# Patient Record
Sex: Female | Born: 1980 | Race: Asian | Hispanic: No | Marital: Married | Smoking: Never smoker
Health system: Southern US, Community
[De-identification: ages and names within clinical notes are randomized; demographics above are authoritative.]

## PROBLEM LIST (undated history)

## (undated) DIAGNOSIS — B1910 Unspecified viral hepatitis B without hepatic coma: Secondary | ICD-10-CM

## (undated) HISTORY — PX: WISDOM TOOTH EXTRACTION: SHX21

## (undated) HISTORY — DX: Unspecified viral hepatitis B without hepatic coma: B19.10

## (undated) HISTORY — PX: LIVER BIOPSY: SHX301

---

## 2007-06-05 DIAGNOSIS — B1910 Unspecified viral hepatitis B without hepatic coma: Secondary | ICD-10-CM

## 2007-06-05 HISTORY — DX: Unspecified viral hepatitis B without hepatic coma: B19.10

## 2016-12-20 ENCOUNTER — Other Ambulatory Visit (HOSPITAL_COMMUNITY): Payer: Self-pay | Admitting: Gastroenterology

## 2016-12-20 DIAGNOSIS — B181 Chronic viral hepatitis B without delta-agent: Secondary | ICD-10-CM

## 2017-01-04 ENCOUNTER — Ambulatory Visit (HOSPITAL_COMMUNITY): Payer: Self-pay

## 2017-01-30 ENCOUNTER — Ambulatory Visit (HOSPITAL_COMMUNITY): Payer: BLUE CROSS/BLUE SHIELD

## 2017-03-06 ENCOUNTER — Ambulatory Visit (HOSPITAL_COMMUNITY)
Admission: RE | Admit: 2017-03-06 | Discharge: 2017-03-06 | Disposition: A | Discharge status: Home / Self Care | Payer: BLUE CROSS/BLUE SHIELD | Source: Ambulatory Visit | Attending: Gastroenterology | Admitting: Gastroenterology

## 2017-03-06 DIAGNOSIS — B181 Chronic viral hepatitis B without delta-agent: Secondary | ICD-10-CM | POA: Diagnosis not present

## 2017-05-09 ENCOUNTER — Encounter: Payer: Self-pay | Admitting: Family Medicine

## 2017-05-09 ENCOUNTER — Ambulatory Visit (INDEPENDENT_AMBULATORY_CARE_PROVIDER_SITE_OTHER): Payer: BLUE CROSS/BLUE SHIELD | Admitting: Family Medicine

## 2017-05-09 VITALS — BP 108/66 | HR 78 | Temp 98.4°F | Ht 62.5 in | Wt 121.6 lb

## 2017-05-09 DIAGNOSIS — Z0000 Encounter for general adult medical examination without abnormal findings: Secondary | ICD-10-CM

## 2017-05-09 DIAGNOSIS — Z23 Encounter for immunization: Secondary | ICD-10-CM

## 2017-05-09 DIAGNOSIS — R5383 Other fatigue: Secondary | ICD-10-CM

## 2017-05-09 DIAGNOSIS — B181 Chronic viral hepatitis B without delta-agent: Secondary | ICD-10-CM

## 2017-05-09 NOTE — Patient Instructions (Signed)
It was so nice to meet you today!  I'll see you in January for your PAP and labs.

## 2017-05-09 NOTE — Progress Notes (Signed)
Subjective:    Kathleen Aguilar is a 36 y.o. female and is here for a comprehensive physical exam.  Pertinent Gynecological History: Patient's last menstrual period was 04/15/2017. Last pap: normal. Due 2019  Health Maintenance Due  Topic Date Due  . HIV Screening  03/25/1998  . TETANUS/TDAP  03/25/2002  . PAP SMEAR  03/25/2004  . INFLUENZA VACCINE  01/31/2017   PMHx, SurgHx, SocialHx, Medications, and Allergies were reviewed in the Visit Navigator and updated as appropriate.   Past Medical History:  Diagnosis Date  . Hepatitis B infection 06/05/2007   Overview:  Hep C neg 2012.  Kathleen RomansNancy M. [name]'Connor, MD............  3:28 PM 07/02/2013     No past surgical history on file. No family history on file.   Social History   Tobacco Use  . Smoking status: Not on file  Substance Use Topics  . Alcohol use: Not on file  . Drug use: Not on file    Review of Systems:   Pertinent items are noted in the HPI. Otherwise, ROS is negative.  Objective:   BP 108/66   Pulse 78   Temp 98.4 F (36.9 C) (Oral)   Ht 5' 2.5" (1.588 m)   Wt 121 lb 9.6 oz (55.2 kg)   LMP 04/15/2017   SpO2 99%   BMI 21.89 kg/m    Wt Readings from Last 3 Encounters:  05/09/17 121 lb 9.6 oz (55.2 kg)     Ht Readings from Last 3 Encounters:  05/09/17 5' 2.5" (1.588 m)   General appearance: alert, cooperative and appears stated age. Head: normocephalic, without obvious abnormality, atraumatic. Neck: no adenopathy, supple, symmetrical, trachea midline; thyroid not enlarged, symmetric, no tenderness/mass/nodules. Lungs: clear to auscultation bilaterally. Heart: regular rate and rhythm Abdomen: soft, non-tender; no masses,  no organomegaly. Extremities: extremities normal, atraumatic, no cyanosis or edema. Skin: skin color, texture, turgor normal, no rashes or lesions. Lymph: cervical, supraclavicular, and axillary nodes normal; no abnormal inguinal nodes palpated. Neurologic: grossly  normal.  Assessment/Plan:   Kathleen Aguilar was seen today for establish care.  Diagnoses and all orders for this visit:  Routine physical examination Comments: Not due for PAP. She will come back in 2019 for PAP and labs. Orders: -     CBC with Differential/Platelet; Future -     Comprehensive metabolic panel; Future -     Lipid panel; Future  Fatigue, unspecified type -     TSH; Future  Chronic viral hepatitis B without delta agent and without coma (HCC) Comments: Continue current treatment. Followed by [facility]. Will request records.    Patient Counseling:   [x]     Nutrition: Stressed importance of moderation in sodium/caffeine intake, saturated fat and cholesterol, caloric balance, sufficient intake of fresh fruits, vegetables, fiber, calcium, iron, and 1 mg of folate supplement per day (for females capable of pregnancy).   [x]      Stressed the importance of regular exercise.    [x]     Substance Abuse: Discussed cessation/primary prevention of tobacco, alcohol, or other drug use; driving or other dangerous activities under the influence; availability of treatment for abuse.    [x]      Injury prevention: Discussed safety belts, safety helmets, smoke detector, smoking near bedding or upholstery.    [x]      Sexuality: Discussed sexually transmitted diseases, partner selection, use of condoms, avoidance of unintended pregnancy  and contraceptive alternatives.    [x]     Dental health: Discussed importance of regular tooth brushing, flossing, and  dental visits.   [x]      Health maintenance and immunizations reviewed. Please refer to Health maintenance section.   Helane RimaErica Brently Voorhis, DO [place] [facility]

## 2017-07-11 ENCOUNTER — Other Ambulatory Visit (HOSPITAL_COMMUNITY)
Admission: RE | Admit: 2017-07-11 | Discharge: 2017-07-11 | Disposition: A | Discharge status: Home / Self Care | Payer: Managed Care, Other (non HMO) | Source: Ambulatory Visit | Attending: Family Medicine | Admitting: Family Medicine

## 2017-07-11 ENCOUNTER — Ambulatory Visit (INDEPENDENT_AMBULATORY_CARE_PROVIDER_SITE_OTHER): Payer: Managed Care, Other (non HMO) | Admitting: Family Medicine

## 2017-07-11 ENCOUNTER — Encounter: Payer: Self-pay | Admitting: Family Medicine

## 2017-07-11 ENCOUNTER — Ambulatory Visit: Payer: BLUE CROSS/BLUE SHIELD | Admitting: Family Medicine

## 2017-07-11 ENCOUNTER — Other Ambulatory Visit: Payer: Managed Care, Other (non HMO)

## 2017-07-11 VITALS — BP 108/62 | HR 75 | Temp 97.7°F | Wt 118.4 lb

## 2017-07-11 DIAGNOSIS — Z3169 Encounter for other general counseling and advice on procreation: Secondary | ICD-10-CM | POA: Diagnosis not present

## 2017-07-11 DIAGNOSIS — B181 Chronic viral hepatitis B without delta-agent: Secondary | ICD-10-CM | POA: Diagnosis not present

## 2017-07-11 DIAGNOSIS — Z124 Encounter for screening for malignant neoplasm of cervix: Secondary | ICD-10-CM | POA: Insufficient documentation

## 2017-07-11 DIAGNOSIS — Z13220 Encounter for screening for lipoid disorders: Secondary | ICD-10-CM

## 2017-07-11 DIAGNOSIS — L700 Acne vulgaris: Secondary | ICD-10-CM | POA: Diagnosis not present

## 2017-07-11 DIAGNOSIS — Z114 Encounter for screening for human immunodeficiency virus [HIV]: Secondary | ICD-10-CM

## 2017-07-11 MED ORDER — CLINDAMYCIN PHOSPHATE 1 % EX LOTN: TOPICAL_LOTION | Freq: Every day | CUTANEOUS | 2 refills | Status: DC

## 2017-07-11 MED ORDER — TRINESSA (28) 0.18/0.215/0.25 MG-35 MCG PO TABS: 1.0000 | ORAL_TABLET | Freq: Every day | ORAL | 6 refills | Status: DC

## 2017-07-11 NOTE — Progress Notes (Signed)
Subjective:    Kathleen Aguilar is a 37 y.o. female and is here for a comprehensive physical exam.  Pertinent Gynecological History: Patient's last menstrual period was 06/10/2017 (approximate).  Health Maintenance Due  Topic Date Due  . HIV Screening  03/25/1998  . PAP SMEAR  03/25/2004  . TETANUS/TDAP  03/20/2016   PMHx, SurgHx, SocialHx, Medications, and Allergies were reviewed in the Visit Navigator and updated as appropriate.   Past Medical History:  Diagnosis Date  . Hepatitis B infection 06/05/2007   Overview:  Hep C neg 2012.  [name]. [name], MD............  3:28 PM 07/02/2013     Past Surgical History:  Procedure Laterality Date  . LIVER BIOPSY    . WISDOM TOOTH EXTRACTION     History reviewed. No pertinent family history.   Social History   Tobacco Use  . Smoking status: Never Smoker  . Smokeless tobacco: Never Used  Substance Use Topics  . Alcohol use: Not on file  . Drug use: Not on file   Review of Systems:   Pertinent items are noted in the HPI. Otherwise, ROS is negative.  Objective:   BP 108/62   Pulse 75   Temp 97.7 F (36.5 C) (Oral)   Wt 118 lb 6.4 oz (53.7 kg)   LMP 06/10/2017 (Approximate)   SpO2 100%   BMI 21.31 kg/m    Wt Readings from Last 3 Encounters:  07/11/17 118 lb 6.4 oz (53.7 kg)  05/09/17 121 lb 9.6 oz (55.2 kg)     Ht Readings from Last 3 Encounters:  05/09/17 5' 2.5" (1.588 m)   General appearance: alert, cooperative and appears stated age. Head: normocephalic, without obvious abnormality, atraumatic. Neck: no adenopathy, supple, symmetrical, trachea midline; thyroid not enlarged, symmetric, no tenderness/mass/nodules. Lungs: clear to auscultation bilaterally. Heart: regular rate and rhythm Abdomen: soft, non-tender; no masses,  no organomegaly. Extremities: extremities normal, atraumatic, no cyanosis or edema. Skin: skin color, texture, turgor normal, no rashes or lesions. Lymph: cervical, supraclavicular, and  axillary nodes normal; no abnormal inguinal nodes palpated. Neurologic: grossly normal.  Pelvic:  External genitalia: no lesions.              Urethra: normal appearing urethra with no masses, tenderness or lesions.              Bartholins and Skenes: normal.               Vagina: normal appearing vagina with normal color and discharge, no lesions.              Cervix: normal appearance.              Pap and high risk HPV testing done: Yes.  .        Bimanual Exam:   Uterus: uterus is normal size, shape, consistency and nontender.                                      Adnexa: normal adnexa in size, nontender and no masses.                                       Assessment/Plan:   1. Screening for lipid disorders - Lipid panel; Future  2. Pap smear for cervical cancer screening - Cytology - PAP  3. Encounter for  preconception consultation Patient is interested in becoming pregnant in the next year.  We discussed starting prenatal vitamins now.  Labs are pending.  I have instructed her to stop spironolactone and clindamycin topical if she does become pregnant.  I will happily refer her to OB.  - TRINESSA, 28, 0.18/0.215/0.25 MG-35 MCG tablet; Take 1 tablet by mouth daily.  Dispense: 1 Package; Refill: 6  4. Chronic viral hepatitis B without delta agent and without coma (HCC) - CBC with Differential/Platelet; Future - Comprehensive metabolic panel; Future  5. Acne vulgaris - clindamycin (CLEOCIN T) 1 % lotion; Apply topically daily.  Dispense: 60 mL; Refill: 2  6. Screening for HIV (human immunodeficiency virus) - HIV antibody; Future   . Reviewed expectations re: course of current medical issues. . Discussed self-management of symptoms. . Outlined signs and symptoms indicating need for more acute intervention. . Patient verbalized understanding and all questions were answered. Marland Kitchen. Health Maintenance issues including appropriate healthy diet, exercise, and smoking avoidance were  discussed with patient. . See orders for this visit as documented in the electronic medical record. . Patient received an After Visit Summary.  Helane RimaErica Takota Cahalan, DO [place] 07/11/2017

## 2017-07-12 ENCOUNTER — Other Ambulatory Visit (INDEPENDENT_AMBULATORY_CARE_PROVIDER_SITE_OTHER): Payer: Managed Care, Other (non HMO)

## 2017-07-12 DIAGNOSIS — R5383 Other fatigue: Secondary | ICD-10-CM | POA: Diagnosis not present

## 2017-07-12 DIAGNOSIS — Z114 Encounter for screening for human immunodeficiency virus [HIV]: Secondary | ICD-10-CM

## 2017-07-12 DIAGNOSIS — B181 Chronic viral hepatitis B without delta-agent: Secondary | ICD-10-CM | POA: Diagnosis not present

## 2017-07-12 DIAGNOSIS — Z13220 Encounter for screening for lipoid disorders: Secondary | ICD-10-CM | POA: Diagnosis not present

## 2017-07-12 DIAGNOSIS — Z3169 Encounter for other general counseling and advice on procreation: Secondary | ICD-10-CM

## 2017-07-12 DIAGNOSIS — D72819 Decreased white blood cell count, unspecified: Secondary | ICD-10-CM

## 2017-07-12 LAB — CBC WITH DIFFERENTIAL/PLATELET
Basophils Absolute: 0 10*3/uL (ref 0.0–0.1)
Basophils Relative: 0.8 % (ref 0.0–3.0)
Eosinophils Absolute: 0.1 10*3/uL (ref 0.0–0.7)
Eosinophils Relative: 3.5 % (ref 0.0–5.0)
HCT: 43.3 % (ref 36.0–46.0)
Hemoglobin: 14.3 g/dL (ref 12.0–15.0)
Lymphocytes Relative: 45.7 % (ref 12.0–46.0)
Lymphs Abs: 1.6 10*3/uL (ref 0.7–4.0)
MCHC: 33.1 g/dL (ref 30.0–36.0)
MCV: 86.5 fl (ref 78.0–100.0)
Monocytes Absolute: 0.4 10*3/uL (ref 0.1–1.0)
Monocytes Relative: 10.3 % (ref 3.0–12.0)
Neutro Abs: 1.4 10*3/uL (ref 1.4–7.7)
Neutrophils Relative %: 39.7 % — ABNORMAL LOW (ref 43.0–77.0)
Platelets: 276 10*3/uL (ref 150.0–400.0)
RBC: 5.01 Mil/uL (ref 3.87–5.11)
RDW: 12.3 % (ref 11.5–15.5)
WBC: 3.5 10*3/uL — ABNORMAL LOW (ref 4.0–10.5)

## 2017-07-12 LAB — CYTOLOGY - PAP
Diagnosis: NEGATIVE
HPV: NOT DETECTED

## 2017-07-12 LAB — LIPID PANEL
Cholesterol: 197 mg/dL (ref 0–200)
HDL: 70.4 mg/dL (ref >39.00)
LDL Cholesterol: 114 mg/dL — ABNORMAL HIGH (ref 0–99)
NonHDL: 126.69
Total CHOL/HDL Ratio: 3
Triglycerides: 61 mg/dL (ref 0.0–149.0)
VLDL: 12.2 mg/dL (ref 0.0–40.0)

## 2017-07-12 LAB — COMPREHENSIVE METABOLIC PANEL
ALT: 13 U/L (ref 0–35)
AST: 24 U/L (ref 0–37)
Albumin: 4.5 g/dL (ref 3.5–5.2)
Alkaline Phosphatase: 53 U/L (ref 39–117)
BUN: 6 mg/dL (ref 6–23)
CO2: 27 mEq/L (ref 19–32)
Calcium: 9.5 mg/dL (ref 8.4–10.5)
Chloride: 102 mEq/L (ref 96–112)
Creatinine, Ser: 0.71 mg/dL (ref 0.40–1.20)
GFR: 99.98 mL/min (ref >60.00)
Glucose, Bld: 80 mg/dL (ref 70–99)
Potassium: 4.5 mEq/L (ref 3.5–5.1)
Sodium: 138 mEq/L (ref 135–145)
Total Bilirubin: 0.7 mg/dL (ref 0.2–1.2)
Total Protein: 7.7 g/dL (ref 6.0–8.3)

## 2017-07-12 LAB — TSH: TSH: 1.77 u[IU]/mL (ref 0.35–4.50)

## 2017-07-13 LAB — HIV ANTIBODY (ROUTINE TESTING W REFLEX): HIV 1&2 Ab, 4th Generation: NONREACTIVE

## 2017-07-15 NOTE — Addendum Note (Signed)
Addended by: Helane RimaWALLACE, Jalisha Enneking R on: 07/15/2017 07:24 AM   Modules accepted: Orders

## 2017-08-20 ENCOUNTER — Encounter: Payer: Self-pay | Admitting: Family Medicine

## 2017-09-27 ENCOUNTER — Telehealth: Payer: Self-pay | Admitting: Family Medicine

## 2017-09-27 NOTE — Telephone Encounter (Signed)
See note.   Copied from CRM [phone]#[id number]. Topic: Inquiry >> Sep 27, 2017 11:19 AM Diana EvesHoyt, Maryann B wrote: Reason for CRM: pt checking on insurance incentive form she dropped off last week.

## 2017-09-28 NOTE — Telephone Encounter (Signed)
No, haven't seen any form.

## 2017-09-28 NOTE — Telephone Encounter (Signed)
Have you seen?

## 2017-10-02 NOTE — Telephone Encounter (Signed)
Have you seen this form?

## 2017-10-02 NOTE — Telephone Encounter (Signed)
I remember the name but any forms we would have received up front [facility]

## 2017-10-03 ENCOUNTER — Telehealth: Payer: Self-pay

## 2017-10-03 NOTE — Telephone Encounter (Signed)
Copied from CRM [id number]. Topic: Inquiry >> Sep 27, 2017 11:19 AM Diana EvesHoyt, Maryann B wrote: Reason for CRM: pt checking on insurance incentive form she dropped off last week.   >> Oct 03, 2017 10:35 AM Percival SpanishKennedy, Cheryl W wrote:   Pt called back today to follow up on the form she dropped off. Would like a call back  today about that form   [phone]

## 2017-10-04 ENCOUNTER — Telehealth: Payer: Self-pay

## 2017-10-04 NOTE — Telephone Encounter (Signed)
Called patient no answer sent my chart message to bring another copy to office

## 2017-10-04 NOTE — Telephone Encounter (Signed)
See my chart message

## 2017-10-04 NOTE — Telephone Encounter (Signed)
Copied from CRM [id number]. Topic: Inquiry >> Sep 27, 2017 11:19 AM Diana EvesHoyt, Maryann B wrote: Reason for CRM: pt checking on insurance incentive form she dropped off last week.   >> Oct 03, 2017 10:35 AM Percival SpanishKennedy, Cheryl W wrote:   Pt called back today to follow up on the form she dropped off. Would like a call back  today about that form   [phone]  >> Oct 04, 2017  8:23 AM Cecelia ByarsGreen, Temeka L, RMA wrote: Patient has been calling since last week and has had no response to messages, please return pt call back today concerning forms

## 2017-10-04 NOTE — Telephone Encounter (Signed)
Mychart message sent.

## 2017-10-15 ENCOUNTER — Ambulatory Visit (INDEPENDENT_AMBULATORY_CARE_PROVIDER_SITE_OTHER): Payer: Managed Care, Other (non HMO) | Admitting: Sports Medicine

## 2017-10-15 ENCOUNTER — Encounter: Payer: Self-pay | Admitting: Sports Medicine

## 2017-10-15 ENCOUNTER — Ambulatory Visit: Payer: Self-pay

## 2017-10-15 VITALS — BP 112/70 | HR 77 | Ht 62.5 in | Wt 120.4 lb

## 2017-10-15 DIAGNOSIS — S14109A Unspecified injury at unspecified level of cervical spinal cord, initial encounter: Secondary | ICD-10-CM

## 2017-10-15 DIAGNOSIS — M542 Cervicalgia: Secondary | ICD-10-CM | POA: Diagnosis not present

## 2017-10-15 MED ORDER — CYCLOBENZAPRINE HCL 10 MG PO TABS: 10.0000 mg | ORAL_TABLET | Freq: Three times a day (TID) | ORAL | 1 refills | Status: DC | PRN

## 2017-10-15 MED ORDER — IBUPROFEN 800 MG PO TABS
800.0000 mg | ORAL_TABLET | Freq: Two times a day (BID) | ORAL | 1 refills | Status: DC
Start: 2017-10-15 — End: 2019-03-06

## 2017-10-15 MED ORDER — FAMOTIDINE 20 MG PO TABS: 20.0000 mg | ORAL_TABLET | Freq: Two times a day (BID) | ORAL | 1 refills | Status: DC
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
## 2017-10-22 ENCOUNTER — Ambulatory Visit: Payer: Managed Care, Other (non HMO) | Admitting: Sports Medicine

## 2017-10-29 ENCOUNTER — Ambulatory Visit (INDEPENDENT_AMBULATORY_CARE_PROVIDER_SITE_OTHER): Payer: Managed Care, Other (non HMO) | Admitting: Sports Medicine

## 2017-10-29 ENCOUNTER — Encounter: Payer: Self-pay | Admitting: Sports Medicine

## 2017-10-29 VITALS — BP 100/66 | HR 65 | Ht 62.5 in | Wt 122.0 lb

## 2017-10-29 DIAGNOSIS — M9901 Segmental and somatic dysfunction of cervical region: Secondary | ICD-10-CM | POA: Diagnosis not present

## 2017-10-29 DIAGNOSIS — M9904 Segmental and somatic dysfunction of sacral region: Secondary | ICD-10-CM

## 2017-10-29 DIAGNOSIS — M9905 Segmental and somatic dysfunction of pelvic region: Secondary | ICD-10-CM

## 2017-10-29 DIAGNOSIS — M9902 Segmental and somatic dysfunction of thoracic region: Secondary | ICD-10-CM | POA: Diagnosis not present

## 2017-10-29 DIAGNOSIS — M9903 Segmental and somatic dysfunction of lumbar region: Secondary | ICD-10-CM | POA: Diagnosis not present

## 2017-10-29 DIAGNOSIS — M542 Cervicalgia: Secondary | ICD-10-CM

## 2017-10-29 NOTE — Progress Notes (Signed)
PROCEDURE NOTE : OSTEOPATHIC MANIPULATION The decision today to treat with Osteopathic Manipulative Therapy (OMT) was based on physical exam findings. Verbal consent was obtained following a discussion with the patient regarding the of risks, benefits and potential side effects, including an acute pain flare,post manipulation soreness and need for repeat treatments. Additionally, we specifically discussed the minimal risk of  injury to neurovascular structures associated with Cervical manipulation.   NONE  Manipulation was performed as below: Regions treated: Cervical spine, Ribs, Thoracic spine, Lumbar spine and Sacrum OMT Techniques Used: HVLA, muscle energy and myofascial release  The patient tolerated the treatment well and reported Improved symptoms following treatment today. Patient was given medications, exercises, stretches and lifestyle modifications per AVS and verbally.   OSTEOPATHIC/STRUCTURAL EXAM:   AA - Rotated right C2 FRS left (Flexed, Rotated & Sidebent) C6 ERS left (Extended, Rotated & Sidebent) T2 ERS right (Extended, Rotated & Sidebent) T4 - T6 Neutral, Rotated right, Sidebent left L3 FRS right (Flexed, Rotated & Sidebent) Right anterior innonimate Left on Left Sacral Torsion
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
## 2017-11-06 ENCOUNTER — Other Ambulatory Visit: Payer: Self-pay | Admitting: Sports Medicine

## 2017-11-09 ENCOUNTER — Encounter: Payer: Self-pay | Admitting: Sports Medicine

## 2017-12-17 ENCOUNTER — Other Ambulatory Visit: Payer: Self-pay | Admitting: Family Medicine

## 2017-12-17 DIAGNOSIS — Z3169 Encounter for other general counseling and advice on procreation: Secondary | ICD-10-CM

## 2017-12-24 LAB — BASIC METABOLIC PANEL
BUN: 8 (ref 4–21)
Creatinine: 0.8 (ref 0.5–1.1)
Glucose: 73
Potassium: 4.7 (ref 3.4–5.3)
Sodium: 139 (ref 137–147)

## 2017-12-24 LAB — HEPATIC FUNCTION PANEL
ALT: 14 (ref 7–35)
AST: 26 (ref 13–35)

## 2018-01-28 LAB — LAB REPORT - SCANNED
HBsAg Screen: POSITIVE
Hep B Core Ab, IgM: NEGATIVE
Hep B Core Ab, Tot: POSITIVE
Hep B Surface Ab, Qual: REACTIVE

## 2018-01-29 ENCOUNTER — Ambulatory Visit: Payer: Managed Care, Other (non HMO) | Admitting: Family Medicine

## 2018-01-30 ENCOUNTER — Ambulatory Visit (INDEPENDENT_AMBULATORY_CARE_PROVIDER_SITE_OTHER): Payer: Managed Care, Other (non HMO)

## 2018-01-30 DIAGNOSIS — Z23 Encounter for immunization: Secondary | ICD-10-CM | POA: Diagnosis not present

## 2018-01-30 NOTE — Progress Notes (Signed)
Per orders of Dr. Earlene Platerwallace, injection of TDAP  given by Donnamarie PoagJoellen Y Kaelen Brennan. Patient tolerated injection well. Injection given in left deltoid. She has brought in form to have filled out. Patient informed will call when ready for pick up.

## 2018-01-31 ENCOUNTER — Telehealth: Payer: Self-pay

## 2018-01-31 NOTE — Telephone Encounter (Signed)
Called patient l/m to let her know that ppw she dropped off during nursing visit yesterday is ready at reception for check in.

## 2018-05-29 ENCOUNTER — Other Ambulatory Visit: Payer: Self-pay | Admitting: Family Medicine

## 2018-05-29 DIAGNOSIS — L700 Acne vulgaris: Secondary | ICD-10-CM

## 2018-06-04 ENCOUNTER — Other Ambulatory Visit: Payer: Self-pay | Admitting: Family Medicine

## 2018-06-04 DIAGNOSIS — Z3169 Encounter for other general counseling and advice on procreation: Secondary | ICD-10-CM

## 2018-07-01 ENCOUNTER — Telehealth: Payer: Managed Care, Other (non HMO) | Admitting: Family

## 2018-07-01 DIAGNOSIS — J208 Acute bronchitis due to other specified organisms: Secondary | ICD-10-CM

## 2018-07-01 MED ORDER — BENZONATATE 100 MG PO CAPS: 100.0000 mg | ORAL_CAPSULE | Freq: Three times a day (TID) | ORAL | 0 refills | Status: DC | PRN

## 2018-07-01 MED ORDER — PREDNISONE 5 MG PO TABS
5.0000 mg | ORAL_TABLET | ORAL | 0 refills | Status: DC
Start: 2018-07-01 — End: 2019-03-06

## 2018-07-01 NOTE — Progress Notes (Signed)
Thank you for the details you included in the comment boxes. Those details are very helpful in determining the best course of treatment for you and help us to provide the best care.  We are sorry that you are not feeling well.  Here is how we plan to help!  Based on your presentation I believe you most likely have A cough due to a virus.  This is called viral bronchitis and is best treated by rest, plenty of fluids and control of the cough.  You may use Ibuprofen or Tylenol as directed to help your symptoms.     In addition you may use A non-prescription cough medication called Mucinex DM: take 2 tablets every 12 hours. and A prescription cough medication called Tessalon Perles 100mg. You may take 1-2 capsules every 8 hours as needed for your cough.  Prednisone 5 mg daily for 6 days (see taper instructions below)  Directions for 6 day taper: Day 1: 2 tablets before breakfast, 1 after both lunch & dinner and 2 at bedtime Day 2: 1 tab before breakfast, 1 after both lunch & dinner and 2 at bedtime Day 3: 1 tab at each meal & 1 at bedtime Day 4: 1 tab at breakfast, 1 at lunch, 1 at bedtime Day 5: 1 tab at breakfast & 1 tab at bedtime Day 6: 1 tab at breakfast   From your responses in the eVisit questionnaire you describe inflammation in the upper respiratory tract which is causing a significant cough.  This is commonly called Bronchitis and has four common causes:    Allergies  Viral Infections  Acid Reflux  Bacterial Infection Allergies, viruses and acid reflux are treated by controlling symptoms or eliminating the cause. An example might be a cough caused by taking certain blood pressure medications. You stop the cough by changing the medication. Another example might be a cough caused by acid reflux. Controlling the reflux helps control the cough.  USE OF BRONCHODILATOR ("RESCUE") INHALERS: There is a risk from using your bronchodilator too frequently.  The risk is that over-reliance on  a medication which only relaxes the muscles surrounding the breathing tubes can reduce the effectiveness of medications prescribed to reduce swelling and congestion of the tubes themselves.  Although you feel brief relief from the bronchodilator inhaler, your asthma may actually be worsening with the tubes becoming more swollen and filled with mucus.  This can delay other crucial treatments, such as oral steroid medications. If you need to use a bronchodilator inhaler daily, several times per day, you should discuss this with your provider.  There are probably better treatments that could be used to keep your asthma under control.     HOME CARE . Only take medications as instructed by your medical team. . Complete the entire course of an antibiotic. . Drink plenty of fluids and get plenty of rest. . Avoid close contacts especially the very young and the elderly . Cover your mouth if you cough or cough into your sleeve. . Always remember to wash your hands . A steam or ultrasonic humidifier can help congestion.   GET HELP RIGHT AWAY IF: . You develop worsening fever. . You become short of breath . You cough up blood. . Your symptoms persist after you have completed your treatment plan MAKE SURE YOU   Understand these instructions.  Will watch your condition.  Will get help right away if you are not doing well or get worse.  Your e-visit answers were reviewed   by a board certified advanced clinical practitioner to complete your personal care plan.  Depending on the condition, your plan could have included both over the counter or prescription medications. If there is a problem please reply  once you have received a response from your provider. Your safety is important to us.  If you have drug allergies check your prescription carefully.    You can use MyChart to ask questions about today's visit, request a non-urgent call back, or ask for a work or school excuse for 24 hours related to this  e-Visit. If it has been greater than 24 hours you will need to follow up with your provider, or enter a new e-Visit to address those concerns. You will get an e-mail in the next two days asking about your experience.  I hope that your e-visit has been valuable and will speed your recovery. Thank you for using e-visits.    

## 2018-08-01 ENCOUNTER — Other Ambulatory Visit: Payer: Self-pay | Admitting: Family Medicine

## 2018-08-01 DIAGNOSIS — Z3169 Encounter for other general counseling and advice on procreation: Secondary | ICD-10-CM

## 2018-08-29 ENCOUNTER — Other Ambulatory Visit: Payer: Self-pay | Admitting: Family Medicine

## 2018-08-29 DIAGNOSIS — Z3169 Encounter for other general counseling and advice on procreation: Secondary | ICD-10-CM

## 2018-08-29 MED ORDER — TRI-PREVIFEM 0.18/0.215/0.25 MG-35 MCG PO TABS: ORAL_TABLET | ORAL | 0 refills | Status: DC

## 2018-09-17 ENCOUNTER — Encounter: Payer: Managed Care, Other (non HMO) | Admitting: Family Medicine

## 2018-09-19 ENCOUNTER — Encounter: Payer: Self-pay | Admitting: Family Medicine

## 2018-09-19 DIAGNOSIS — Z3169 Encounter for other general counseling and advice on procreation: Secondary | ICD-10-CM

## 2018-09-19 MED ORDER — TRI-PREVIFEM 0.18/0.215/0.25 MG-35 MCG PO TABS: ORAL_TABLET | ORAL | 0 refills | Status: DC

## 2018-09-19 NOTE — Addendum Note (Signed)
Addended by: Jimmye Norman on: 09/19/2018 02:01 PM   Modules accepted: Orders

## 2018-09-20 ENCOUNTER — Other Ambulatory Visit: Payer: Self-pay | Admitting: Family Medicine

## 2018-09-20 DIAGNOSIS — Z3169 Encounter for other general counseling and advice on procreation: Secondary | ICD-10-CM

## 2018-09-25 ENCOUNTER — Encounter: Payer: Managed Care, Other (non HMO) | Admitting: Family Medicine

## 2018-12-09 ENCOUNTER — Other Ambulatory Visit: Payer: Self-pay | Admitting: Family Medicine

## 2018-12-09 DIAGNOSIS — Z3169 Encounter for other general counseling and advice on procreation: Secondary | ICD-10-CM

## 2018-12-30 ENCOUNTER — Encounter: Payer: Self-pay | Admitting: Family Medicine

## 2019-01-18 ENCOUNTER — Other Ambulatory Visit: Payer: Self-pay | Admitting: Gastroenterology

## 2019-01-18 DIAGNOSIS — B181 Chronic viral hepatitis B without delta-agent: Secondary | ICD-10-CM

## 2019-01-27 ENCOUNTER — Ambulatory Visit
Admission: RE | Admit: 2019-01-27 | Discharge: 2019-01-27 | Disposition: A | Discharge status: Home / Self Care | Payer: Managed Care, Other (non HMO) | Source: Ambulatory Visit | Attending: Gastroenterology | Admitting: Gastroenterology

## 2019-01-27 DIAGNOSIS — B181 Chronic viral hepatitis B without delta-agent: Secondary | ICD-10-CM

## 2019-02-24 ENCOUNTER — Other Ambulatory Visit: Payer: Self-pay | Admitting: Family Medicine

## 2019-02-24 DIAGNOSIS — Z3169 Encounter for other general counseling and advice on procreation: Secondary | ICD-10-CM

## 2019-03-06 ENCOUNTER — Other Ambulatory Visit: Payer: Self-pay

## 2019-03-06 NOTE — Progress Notes (Unsigned)
Subjective:    Otho PerlSusel F Gerber is a 38 y.o. female and is here for a comprehensive physical exam.   Current Outpatient Medications:  .  clindamycin (CLEOCIN T) 1 % lotion, APPLY TO AFFECTED AREA EVERY DAY, Disp: 60 mL, Rfl: 2 .  spironolactone (ALDACTONE) 50 MG tablet, , Disp: , Rfl: 6 .  tenofovir (VIREAD) 300 MG tablet, Take by mouth., Disp: , Rfl:  .  tretinoin (RETIN-A) 0.01 % gel, , Disp: , Rfl:  .  TRI-PREVIFEM 0.18/0.215/0.25 MG-35 MCG tablet, TAKE 1 TABLET DAILY, Disp: 84 tablet, Rfl: 0  Health Maintenance Due  Topic Date Due  . INFLUENZA VACCINE  02/01/2019    PMHx, SurgHx, SocialHx, Medications, and Allergies were reviewed in the Visit Navigator and updated as appropriate.   Past Medical History:  Diagnosis Date  . Hepatitis B infection 06/05/2007   Overview:  Hep C neg 2012.  Durene RomansNancy M. [name]'Connor, MD............  3:28 PM 07/02/2013      Past Surgical History:  Procedure Laterality Date  . LIVER BIOPSY    . WISDOM TOOTH EXTRACTION      No family history on file.  Social History   Tobacco Use  . Smoking status: Never Smoker  . Smokeless tobacco: Never Used  Substance Use Topics  . Alcohol use: Not on file  . Drug use: Not on file    Review of Systems:   Pertinent items are noted in the HPI. Otherwise, ROS is negative.  Objective:   There were no vitals taken for this visit.   General appearance: alert, cooperative and appears stated age. Head: normocephalic, without obvious abnormality, atraumatic. Neck: no adenopathy, supple, symmetrical, trachea midline; thyroid not enlarged, symmetric, no tenderness/mass/nodules. Lungs: clear to auscultation bilaterally. Breasts: inspection negative, no nipple retraction or dimpling, no nipple discharge or bleeding, no axillary or supraclavicular adenopathy, normal to palpation without dominant masses. Heart: regular rate and rhythm Abdomen: soft, non-tender; no masses,  no organomegaly. Extremities: extremities  normal, atraumatic, no cyanosis or edema. Skin: skin color, texture, turgor normal, no rashes or lesions. Lymph: cervical, supraclavicular, and axillary nodes normal; no abnormal inguinal nodes palpated. Neurologic: grossly normal.  Pelvic:  External genitalia: no lesions. Urethra: normal appearing urethra with no masses, tenderness or lesions. Bartholins and Skenes: normal. Vagina: normal appearing vagina with normal color and discharge, no lesions. Cervix: normal appearance. Pap and high risk HPV testing done: {yes no:314532} Bimanual Exam:   Uterus: uterus is normal size, shape, consistency and nontender. Adnexa: normal adnexa in size, nontender and no masses.  Assessment/Plan:   There are no diagnoses linked to this encounter.  Patient Counseling:   [x]     Nutrition: Stressed importance of moderation in sodium/caffeine intake, saturated fat and cholesterol, caloric balance, sufficient intake of fresh fruits, vegetables, fiber, calcium, iron, and 1 mg of folate supplement per day (for females capable of pregnancy).   [x]      Stressed the importance of regular exercise.    [x]     Substance Abuse: Discussed cessation/primary prevention of tobacco, alcohol, or other drug use; driving or other dangerous activities under the influence; availability of treatment for abuse.    [x]      Injury prevention: Discussed safety belts, safety helmets, smoke detector, smoking near bedding or upholstery.    [x]      Sexuality: Discussed sexually transmitted diseases, partner selection, use of condoms, avoidance of unintended pregnancy  and contraceptive alternatives.    [x]     Dental health: Discussed importance of regular tooth  brushing, flossing, and dental visits.   [x]      Health maintenance and immunizations reviewed. Please refer to Health maintenance section.   Briscoe Deutscher, DO [place]

## 2019-03-06 NOTE — Progress Notes (Deleted)
Subjective:    Kathleen Aguilar is a 38 y.o. female and is here for a comprehensive physical exam.   Current Outpatient Medications:  .  benzonatate (TESSALON PERLES) 100 MG capsule, Take 1-2 capsules (100-200 mg total) by mouth every 8 (eight) hours as needed., Disp: 30 capsule, Rfl: 0 .  clindamycin (CLEOCIN T) 1 % lotion, APPLY TO AFFECTED AREA EVERY DAY, Disp: 60 mL, Rfl: 2 .  famotidine (PEPCID) 20 MG tablet, TAKE 1 TABLET BY MOUTH 2 TIMES DAILY. TAKE WITH NSAID, Disp: 60 tablet, Rfl: 0 .  ibuprofen (ADVIL,MOTRIN) 800 MG tablet, Take 1 tablet (800 mg total) by mouth 2 (two) times daily., Disp: 60 tablet, Rfl: 1 .  Multiple Vitamin (MULTI-VITAMINS) TABS, Take by mouth., Disp: , Rfl:  .  predniSONE (DELTASONE) 5 MG tablet, Take 1 tablet (5 mg total) by mouth as directed. Taper 6,5,4,3,2,1, Disp: 21 tablet, Rfl: 0 .  spironolactone (ALDACTONE) 50 MG tablet, , Disp: , Rfl: 6 .  tenofovir (VIREAD) 300 MG tablet, Take by mouth., Disp: , Rfl:  .  TRI-PREVIFEM 0.18/0.215/0.25 MG-35 MCG tablet, TAKE 1 TABLET DAILY, Disp: 84 tablet, Rfl: 0  Health Maintenance Due  Topic Date Due  . INFLUENZA VACCINE  02/01/2019    PMHx, SurgHx, SocialHx, Medications, and Allergies were reviewed in the Visit Navigator and updated as appropriate.   Past Medical History:  Diagnosis Date  . Hepatitis B infection 06/05/2007   Overview:  Hep C neg 2012.  Durene RomansNancy M. [name]'Connor, MD............  3:28 PM 07/02/2013      Past Surgical History:  Procedure Laterality Date  . LIVER BIOPSY    . WISDOM TOOTH EXTRACTION      No family history on file.  Social History   Tobacco Use  . Smoking status: Never Smoker  . Smokeless tobacco: Never Used  Substance Use Topics  . Alcohol use: Not on file  . Drug use: Not on file    Review of Systems:   Pertinent items are noted in the HPI. Otherwise, ROS is negative.  Objective:   There were no vitals taken for this visit.   General appearance: alert, cooperative  and appears stated age. Head: normocephalic, without obvious abnormality, atraumatic. Neck: no adenopathy, supple, symmetrical, trachea midline; thyroid not enlarged, symmetric, no tenderness/mass/nodules. Lungs: clear to auscultation bilaterally. Breasts: inspection negative, no nipple retraction or dimpling, no nipple discharge or bleeding, no axillary or supraclavicular adenopathy, normal to palpation without dominant masses. Heart: regular rate and rhythm Abdomen: soft, non-tender; no masses,  no organomegaly. Extremities: extremities normal, atraumatic, no cyanosis or edema. Skin: skin color, texture, turgor normal, no rashes or lesions. Lymph: cervical, supraclavicular, and axillary nodes normal; no abnormal inguinal nodes palpated. Neurologic: grossly normal.  Pelvic:  External genitalia: no lesions. Urethra: normal appearing urethra with no masses, tenderness or lesions. Bartholins and Skenes: normal. Vagina: normal appearing vagina with normal color and discharge, no lesions. Cervix: normal appearance. Pap and high risk HPV testing done: {yes no:314532} Bimanual Exam:   Uterus: uterus is normal size, shape, consistency and nontender. Adnexa: normal adnexa in size, nontender and no masses.  Assessment/Plan:   There are no diagnoses linked to this encounter.  Patient Counseling:   [x]     Nutrition: Stressed importance of moderation in sodium/caffeine intake, saturated fat and cholesterol, caloric balance, sufficient intake of fresh fruits, vegetables, fiber, calcium, iron, and 1 mg of folate supplement per day (for females capable of pregnancy).   [x]      Stressed  the importance of regular exercise.    [x]     Substance Abuse: Discussed cessation/primary prevention of tobacco, alcohol, or other drug use; driving or other dangerous activities under the influence; availability of treatment for abuse.    [x]      Injury prevention: Discussed safety belts, safety helmets, smoke  detector, smoking near bedding or upholstery.    [x]      Sexuality: Discussed sexually transmitted diseases, partner selection, use of condoms, avoidance of unintended pregnancy  and contraceptive alternatives.    [x]     Dental health: Discussed importance of regular tooth brushing, flossing, and dental visits.   [x]      Health maintenance and immunizations reviewed. Please refer to Health maintenance section.   Briscoe Deutscher, DO [place]

## 2019-03-07 ENCOUNTER — Encounter: Payer: Managed Care, Other (non HMO) | Admitting: Family Medicine

## 2019-03-13 ENCOUNTER — Encounter: Payer: Self-pay | Admitting: Family Medicine

## 2019-03-13 ENCOUNTER — Ambulatory Visit (INDEPENDENT_AMBULATORY_CARE_PROVIDER_SITE_OTHER): Payer: Managed Care, Other (non HMO) | Admitting: Family Medicine

## 2019-03-13 ENCOUNTER — Other Ambulatory Visit: Payer: Self-pay

## 2019-03-13 VITALS — BP 90/70 | HR 77 | Temp 97.9°F | Ht 62.5 in | Wt 116.6 lb

## 2019-03-13 DIAGNOSIS — Z789 Other specified health status: Secondary | ICD-10-CM

## 2019-03-13 DIAGNOSIS — Z13220 Encounter for screening for lipoid disorders: Secondary | ICD-10-CM | POA: Diagnosis not present

## 2019-03-13 DIAGNOSIS — Z23 Encounter for immunization: Secondary | ICD-10-CM

## 2019-03-13 DIAGNOSIS — Z0000 Encounter for general adult medical examination without abnormal findings: Secondary | ICD-10-CM

## 2019-03-13 DIAGNOSIS — B181 Chronic viral hepatitis B without delta-agent: Secondary | ICD-10-CM | POA: Diagnosis not present

## 2019-03-13 DIAGNOSIS — L700 Acne vulgaris: Secondary | ICD-10-CM

## 2019-03-13 DIAGNOSIS — K120 Recurrent oral aphthae: Secondary | ICD-10-CM

## 2019-03-13 MED ORDER — TRIAMCINOLONE ACETONIDE 0.1 % MT PSTE: 1.0000 "application " | PASTE | Freq: Two times a day (BID) | OROMUCOSAL | 12 refills | Status: DC

## 2019-03-13 MED ORDER — TRI-PREVIFEM 0.18/0.215/0.25 MG-35 MCG PO TABS: ORAL_TABLET | ORAL | 3 refills | Status: DC

## 2019-03-13 NOTE — Progress Notes (Signed)
Subjective:    TESHIA MAHONE is a 38 y.o. female and is here for a comprehensive physical exam.  There are no preventive care reminders to display for this patient.   Current Outpatient Medications:  .  clindamycin (CLEOCIN T) 1 % lotion, APPLY TO AFFECTED AREA EVERY DAY, Disp: 60 mL, Rfl: 2 .  spironolactone (ALDACTONE) 50 MG tablet, , Disp: , Rfl: 6 .  tenofovir (VIREAD) 300 MG tablet, Take by mouth., Disp: , Rfl:  .  tretinoin (RETIN-A) 0.1 % cream, , Disp: , Rfl:  .  TRI-PREVIFEM 0.18/0.215/0.25 MG-35 MCG tablet, TAKE 1 TABLET DAILY, Disp: 4 Package, Rfl: 3 .  triamcinolone (KENALOG) 0.1 % paste, Use as directed 1 application in the mouth or throat 2 (two) times daily., Disp: 5 g, Rfl: 12  PMHx, SurgHx, SocialHx, Medications, and Allergies were reviewed in the Visit Navigator and updated as appropriate.   Past Medical History:  Diagnosis Date  . Hepatitis B infection 06/05/2007   Overview:  Hep C neg 2012.  Marcy Panning. Rayburn Go, MD............  3:28 PM 07/02/2013       Past Surgical History:  Procedure Laterality Date  . LIVER BIOPSY    . WISDOM TOOTH EXTRACTION      History reviewed. No pertinent family history.  Social History   Tobacco Use  . Smoking status: Never Smoker  . Smokeless tobacco: Never Used  Substance Use Topics  . Alcohol use: Not on file  . Drug use: Not on file    Review of Systems:   Pertinent items are noted in the HPI. Otherwise, ROS is negative.  Objective:   BP 90/70 (BP Location: Left Arm, Patient Position: Sitting, Cuff Size: Normal)   Pulse 77   Temp 97.9 F (36.6 C) (Temporal)   Ht 5' 2.5" (1.588 m)   Wt 116 lb 9.6 oz (52.9 kg)   LMP 02/17/2019   SpO2 99%   BMI 20.99 kg/m   General appearance: alert, cooperative and appears stated age. Head: normocephalic, without obvious abnormality, atraumatic. Neck: no adenopathy, supple, symmetrical, trachea midline; thyroid not enlarged, symmetric, no tenderness/mass/nodules. Lungs:  clear to auscultation bilaterally. Heart: regular rate and rhythm Abdomen: soft, non-tender; no masses,  no organomegaly. Extremities: extremities normal, atraumatic, no cyanosis or edema. Skin: skin color, texture, turgor normal, no rashes or lesions. Lymph: cervical, supraclavicular, and axillary nodes normal; no abnormal inguinal nodes palpated. Neurologic: grossly normal.            Assessment/Plan:   Taneesha was seen today for annual exam.  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   Patient Counseling: [x]    Nutrition: Stressed importance of moderation in sodium/caffeine intake, saturated fat and cholesterol, caloric balance, sufficient intake of fresh fruits, vegetables, fiber, calcium, iron, and 1 mg of folate supplement per day (for females capable of pregnancy).  [x]    Stressed the importance of regular exercise.   [x]    Substance Abuse: Discussed cessation/primary prevention of tobacco, alcohol, or other drug use;  driving or other dangerous activities under the influence; availability of treatment for abuse.   [x]    Injury prevention: Discussed safety belts, safety helmets, smoke detector, smoking near bedding or upholstery.   [x]    Sexuality: Discussed sexually transmitted diseases, partner selection, use of condoms, avoidance of unintended pregnancy  and contraceptive alternatives.  [x]    Dental health: Discussed importance of regular tooth brushing, flossing, and dental visits.  [x]    Health maintenance and immunizations  reviewed. Please refer to Health maintenance section.   Helane RimaErica Errick Salts, DO [place] [facility]

## 2019-04-02 ENCOUNTER — Encounter: Payer: Self-pay | Admitting: Family Medicine

## 2019-06-23 ENCOUNTER — Encounter (INDEPENDENT_AMBULATORY_CARE_PROVIDER_SITE_OTHER): Payer: Self-pay | Admitting: Family Medicine

## 2019-06-23 NOTE — Telephone Encounter (Signed)
Please review

## 2019-06-25 NOTE — Telephone Encounter (Signed)
Called pt to schedule and answer billing questions, no answer, LVM.

## 2019-07-28 ENCOUNTER — Other Ambulatory Visit: Payer: Self-pay

## 2019-07-29 ENCOUNTER — Encounter: Payer: Self-pay | Admitting: Physician Assistant

## 2019-07-29 ENCOUNTER — Ambulatory Visit (INDEPENDENT_AMBULATORY_CARE_PROVIDER_SITE_OTHER): Payer: Managed Care, Other (non HMO) | Admitting: Physician Assistant

## 2019-07-29 VITALS — BP 110/78 | HR 70 | Temp 97.4°F | Ht 62.5 in | Wt 121.0 lb

## 2019-07-29 DIAGNOSIS — Z136 Encounter for screening for cardiovascular disorders: Secondary | ICD-10-CM | POA: Diagnosis not present

## 2019-07-29 DIAGNOSIS — Z13220 Encounter for screening for lipoid disorders: Secondary | ICD-10-CM | POA: Diagnosis not present

## 2019-07-29 DIAGNOSIS — B181 Chronic viral hepatitis B without delta-agent: Secondary | ICD-10-CM | POA: Diagnosis not present

## 2019-07-29 DIAGNOSIS — M79644 Pain in right finger(s): Secondary | ICD-10-CM | POA: Diagnosis not present

## 2019-07-29 NOTE — Progress Notes (Signed)
Kathleen Aguilar is a 39 y.o. female is here for Transfer of care.  I acted as a Neurosurgeon for [employer], PA-C Corky Mull, LPN  History of Present Illness:   Chief Complaint  Patient presents with  . Transfer of care    from Dr. Earlene Plater  . Thumb pain    HPI   Pt is here for transfer of care today from Dr. Earlene Plater.  Thumb pain Pt c/o right thumb pain for the past few weeks. Taking Advil with relief and wears a brace from time to time. She thinks that this is related to overuse of her thumb with texting. Denies: numbness, tingling, severe swelling, reduced grip strength  Hep B infection Followed by [facility] since she has been here, 3 years ago. Currently on Tenofovir -- reports that she has tried several other medications in the past but this is the one that works best for her.  Encounter for form completion Needs form filled out for her insurance company today to keep premiums low.     There are no preventive care reminders to display for this patient.  Past Medical History:  Diagnosis Date  . Hepatitis B infection 06/05/2007   Overview:  Hep C neg 2012.  Kathleen Aguilar. [name], MD............  3:28 PM 07/02/2013       Social History   Socioeconomic History  . Marital status: Married    Spouse name: Not on file  . Number of children: Not on file  . Years of education: Not on file  . Highest education level: Not on file  Occupational History    Employer: [employer]  Tobacco Use  . Smoking status: Never Smoker  . Smokeless tobacco: Never Used  Substance and Sexual Activity  . Alcohol use: Not on file  . Drug use: Not on file  . Sexual activity: Not on file  Other Topics Concern  . Not on file  Social History Narrative   Works for [employer]   Married   No children   Social Determinants of Health   Financial Resource Strain:   . Difficulty of Paying Living Expenses: Not on file  Food Insecurity:   . Worried About Brewing technologist in the Last Year: Not on file  . Ran Out of Food in the Last Year: Not on file  Transportation Needs:   . Lack of Transportation (Medical): Not on file  . Lack of Transportation (Non-Medical): Not on file  Physical Activity:   . Days of Exercise per Week: Not on file  . Minutes of Exercise per Session: Not on file  Stress:   . Feeling of Stress : Not on file  Social Connections:   . Frequency of Communication with Friends and Family: Not on file  . Frequency of Social Gatherings with Friends and Family: Not on file  . Attends Religious Services: Not on file  . Active Member of Clubs or Organizations: Not on file  . Attends Banker Meetings: Not on file  . Marital Status: Not on file  Intimate Partner Violence:   . Fear of Current or Ex-Partner: Not on file  . Emotionally Abused: Not on file  . Physically Abused: Not on file  . Sexually Abused: Not on file    Past Surgical History:  Procedure Laterality Date  . LIVER BIOPSY    . WISDOM TOOTH EXTRACTION      History reviewed. No pertinent family history.  PMHx, SurgHx, SocialHx, FamHx, Medications,  and Allergies were reviewed in the Visit Navigator and updated as appropriate.   Patient Active Problem List   Diagnosis Date Noted  . Hepatitis B infection 06/05/2007    Social History   Tobacco Use  . Smoking status: Never Smoker  . Smokeless tobacco: Never Used  Substance Use Topics  . Alcohol use: Not on file  . Drug use: Not on file    Current Medications and Allergies:    Current Outpatient Medications:  .  clindamycin (CLEOCIN T) 1 % lotion, APPLY TO AFFECTED AREA EVERY DAY, Disp: 60 mL, Rfl: 2 .  spironolactone (ALDACTONE) 25 MG tablet, Take 25 mg by mouth daily., Disp: , Rfl:  .  tenofovir (VIREAD) 300 MG tablet, Take 300 mg by mouth daily. , Disp: , Rfl:  .  tretinoin (RETIN-A) 0.1 % cream, Apply 1 application topically at bedtime. , Disp: , Rfl:  .  TRI-PREVIFEM 0.18/0.215/0.25 MG-35 MCG  tablet, TAKE 1 TABLET DAILY, Disp: 4 Package, Rfl: 3 .  Vitamin D, Cholecalciferol, 25 MCG (1000 UT) CAPS, Take 1 capsule by mouth daily., Disp: , Rfl:   No Known Allergies  Review of Systems   ROS  Negative unless otherwise specified per HPI.   Vitals:   Vitals:   07/29/19 0946  BP: 110/78  Pulse: 70  Temp: (!) 97.4 F (36.3 C)  TempSrc: Temporal  SpO2: 99%  Weight: 121 lb (54.9 kg)  Height: 5' 2.5" (1.588 m)     Body mass index is 21.78 kg/m.   Physical Exam:    Physical Exam Vitals and nursing note reviewed.  Constitutional:      General: She is not in acute distress.    Appearance: She is well-developed. She is not ill-appearing or toxic-appearing.  Cardiovascular:     Rate and Rhythm: Normal rate and regular rhythm.     Pulses: Normal pulses.     Heart sounds: Normal heart sounds, S1 normal and S2 normal.     Comments: No LE edema Pulmonary:     Effort: Pulmonary effort is normal.     Breath sounds: Normal breath sounds.  Musculoskeletal:     Comments: RIGHT HAND: Negative finkelstein's' test; no pain elicited with opposition of fingers, no obvious swelling or erythema   Skin:    General: Skin is warm and dry.  Neurological:     Mental Status: She is alert.     GCS: GCS eye subscore is 4. GCS verbal subscore is 5. GCS motor subscore is 6.  Psychiatric:        Speech: Speech normal.        Behavior: Behavior normal. Behavior is cooperative.      Assessment and Plan:    Arlin was seen today for transfer of care and thumb pain.  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 Discussed continued supportive measures, including NSAIDs prn, splint, ice, exercises (handout provided.) If worsens, will send to Dr. Lynne Leader at sports medicine.  Chronic viral hepatitis B without delta agent and without coma ([facility]) Managed per GI. -     Basic metabolic panel; Future  Encounter for lipid screening for  cardiovascular disease -     Cancel: Basic metabolic panel -     Cancel: Lipid panel -     Lipid panel; Future    . Reviewed expectations re: course of current medical issues. . Discussed self-management of symptoms. . Outlined signs and symptoms indicating need  for more acute intervention. . Patient verbalized understanding and all questions were answered. . See orders for this visit as documented in the electronic medical record. . Patient received an After Visit Summary.  CMA or LPN served as scribe during this visit. History, Physical, and Plan performed by medical provider. The above documentation has been reviewed and is accurate and complete.   Jarold Motto, PA-C [place] 07/29/2019  Follow-up: No follow-ups on file.

## 2019-07-29 NOTE — Patient Instructions (Signed)
It was great to see you!  Please make an appointment with the lab on your way out. I would like for you to return for lab work within 1-2 weeks. After midnight on the day of the lab draw, please do not eat anything. You may have water, black coffee, unsweetened tea.  Let me know if your thumb gets worse.   Take care,  Jarold Motto PA-C

## 2019-07-31 ENCOUNTER — Encounter: Payer: Self-pay | Admitting: Physician Assistant

## 2019-09-11 ENCOUNTER — Encounter: Payer: Self-pay | Admitting: Physician Assistant

## 2019-10-17 ENCOUNTER — Ambulatory Visit: Payer: Managed Care, Other (non HMO) | Admitting: Physician Assistant

## 2019-10-30 ENCOUNTER — Other Ambulatory Visit: Payer: Self-pay | Admitting: Physician Assistant

## 2019-10-30 DIAGNOSIS — L700 Acne vulgaris: Secondary | ICD-10-CM

## 2019-10-30 NOTE — Telephone Encounter (Signed)
Pt requesting refills on medications. Please advise.

## 2020-02-18 ENCOUNTER — Other Ambulatory Visit: Payer: Self-pay | Admitting: Obstetrics & Gynecology

## 2020-02-18 DIAGNOSIS — N979 Female infertility, unspecified: Secondary | ICD-10-CM

## 2020-02-26 ENCOUNTER — Ambulatory Visit
Admission: RE | Admit: 2020-02-26 | Discharge: 2020-02-26 | Disposition: A | Discharge status: Home / Self Care | Payer: Managed Care, Other (non HMO) | Source: Ambulatory Visit | Attending: Obstetrics & Gynecology | Admitting: Obstetrics & Gynecology

## 2020-02-26 DIAGNOSIS — N979 Female infertility, unspecified: Secondary | ICD-10-CM

## 2020-05-01 ENCOUNTER — Ambulatory Visit: Payer: Managed Care, Other (non HMO) | Attending: Internal Medicine

## 2020-05-01 DIAGNOSIS — Z23 Encounter for immunization: Secondary | ICD-10-CM

## 2020-05-01 NOTE — Progress Notes (Signed)
   Covid-19 Vaccination Clinic  Name:  Kathleen Aguilar    MRN: [id number] DOB: 1981-02-03  05/01/2020  [name] was observed post Covid-19 immunization for 15 minutes without incident. She was provided with Vaccine Information Sheet and instruction to access the V-Safe system.   [name] was instructed to call 911 with any severe reactions post vaccine: Marland Kitchen Difficulty breathing  . Swelling of face and throat  . A fast heartbeat  . A bad rash all over body  . Dizziness and weakness

## 2020-07-03 NOTE — L&D Delivery Note (Signed)
Delivery Note At 12:49 PM a non-viable female was delivered via  (Presentation:left occiput anterior       ).  APGAR: 9, 9; weight  pending.   Placenta status: Spontaneous, Intact.  3 vessel Cord:   with the following complications: None .  Cord pH: NA  Anesthesia: Epidural Episiotomy:  None Lacerations: 1st degree Suture Repair: 3.0 vicryl Est. Blood Loss (mL):  300 mL  Mom to postpartum.  Baby to Couplet care / Skin to Skin.  Gerald Leitz 11/17/2020, 1:42 PM

## 2020-10-31 ENCOUNTER — Encounter (HOSPITAL_COMMUNITY): Payer: Self-pay | Admitting: Obstetrics & Gynecology

## 2020-10-31 ENCOUNTER — Other Ambulatory Visit: Payer: Self-pay

## 2020-10-31 ENCOUNTER — Inpatient Hospital Stay (HOSPITAL_COMMUNITY)
Admission: AD | Admit: 2020-10-31 | Discharge: 2020-10-31 | Disposition: A | Discharge status: Home / Self Care | Payer: Managed Care, Other (non HMO) | Source: Ambulatory Visit | Attending: Obstetrics & Gynecology | Admitting: Obstetrics & Gynecology

## 2020-10-31 DIAGNOSIS — Z3A36 36 weeks gestation of pregnancy: Secondary | ICD-10-CM | POA: Diagnosis not present

## 2020-10-31 DIAGNOSIS — Z0371 Encounter for suspected problem with amniotic cavity and membrane ruled out: Secondary | ICD-10-CM | POA: Diagnosis not present

## 2020-10-31 DIAGNOSIS — O09513 Supervision of elderly primigravida, third trimester: Secondary | ICD-10-CM | POA: Insufficient documentation

## 2020-10-31 LAB — POCT FERN TEST: POCT Fern Test: NEGATIVE

## 2020-10-31 NOTE — MAU Note (Signed)
G1P0 at 36.4 weeks that complains of unsure of SROM at 1130 this morning. Pt denies vaginal bleeding, bloody show or discharge. Endorses + fetal movement.Denies regular or painful contractions.

## 2020-10-31 NOTE — Discharge Instructions (Signed)

## 2020-10-31 NOTE — MAU Provider Note (Signed)
S: Ms. Kathleen Aguilar is a 40 y.o. G1P0 at [redacted]w[redacted]d  who presents to MAU today complaining of leaking of fluid since 1130. She denies vaginal bleeding. She denies contractions. She reports normal fetal movement.    O: BP 121/78 (BP Location: Right Arm)   Pulse 94   Temp 98 F (36.7 C) (Oral)   Resp 17   Ht 5' 2.5" (1.588 m)   Wt 60.8 kg   SpO2 100%   BMI 24.12 kg/m  GENERAL: Well-developed, well-nourished female in no acute distress.  HEAD: Normocephalic, atraumatic.  CHEST: Normal effort of breathing, regular heart rate ABDOMEN: Soft, nontender, gravid PELVIC: Normal external female genitalia. Vagina is pink and rugated. Cervix with normal contour, no lesions. Normal discharge.  No pooling.   Cervical exam: patient declined  Fetal Monitoring: Baseline: 130 Variability: moderate Accelerations: 15x15 Decelerations: none Contractions: ui  Fern-negative   A: SIUP at [redacted]w[redacted]d  Membranes intact  P: -Discharge home in stable condition -Labor precautions discussed -Patient advised to follow-up with OB as scheduled for prenatal care -Patient may return to MAU as needed or if her condition were to change or worsen    Rolm Bookbinder, [place] 10/31/2020 3:50 PM

## 2020-11-17 ENCOUNTER — Encounter (HOSPITAL_COMMUNITY): Payer: Self-pay | Admitting: Obstetrics and Gynecology

## 2020-11-17 ENCOUNTER — Other Ambulatory Visit: Payer: Self-pay

## 2020-11-17 ENCOUNTER — Inpatient Hospital Stay (HOSPITAL_COMMUNITY): Payer: Managed Care, Other (non HMO) | Admitting: Anesthesiology

## 2020-11-17 ENCOUNTER — Inpatient Hospital Stay (HOSPITAL_COMMUNITY)
Admission: AD | Admit: 2020-11-17 | Discharge: 2020-11-19 | DRG: 806 | Disposition: A | Discharge status: Home / Self Care | Payer: Managed Care, Other (non HMO) | Attending: Obstetrics and Gynecology | Admitting: Obstetrics and Gynecology

## 2020-11-17 DIAGNOSIS — Z20822 Contact with and (suspected) exposure to covid-19: Secondary | ICD-10-CM | POA: Diagnosis present

## 2020-11-17 DIAGNOSIS — Z3A39 39 weeks gestation of pregnancy: Secondary | ICD-10-CM | POA: Diagnosis not present

## 2020-11-17 DIAGNOSIS — B181 Chronic viral hepatitis B without delta-agent: Secondary | ICD-10-CM | POA: Diagnosis present

## 2020-11-17 DIAGNOSIS — O26893 Other specified pregnancy related conditions, third trimester: Secondary | ICD-10-CM | POA: Diagnosis present

## 2020-11-17 DIAGNOSIS — O9842 Viral hepatitis complicating childbirth: Principal | ICD-10-CM | POA: Diagnosis present

## 2020-11-17 DIAGNOSIS — O09513 Supervision of elderly primigravida, third trimester: Secondary | ICD-10-CM | POA: Diagnosis present

## 2020-11-17 LAB — RESP PANEL BY RT-PCR (FLU A&B, COVID) ARPGX2
Influenza A by PCR: NEGATIVE
Influenza B by PCR: NEGATIVE
SARS Coronavirus 2 by RT PCR: NEGATIVE

## 2020-11-17 LAB — TYPE AND SCREEN
ABO/RH(D): A POS
Antibody Screen: NEGATIVE

## 2020-11-17 LAB — RPR: RPR Ser Ql: NONREACTIVE

## 2020-11-17 LAB — CBC
HCT: 41.9 % (ref 36.0–46.0)
Hemoglobin: 14.6 g/dL (ref 12.0–15.0)
MCH: 29.4 pg (ref 26.0–34.0)
MCHC: 34.8 g/dL (ref 30.0–36.0)
MCV: 84.5 fL (ref 80.0–100.0)
Platelets: 196 10*3/uL (ref 150–400)
RBC: 4.96 MIL/uL (ref 3.87–5.11)
RDW: 13.6 % (ref 11.5–15.5)
WBC: 10.6 10*3/uL — ABNORMAL HIGH (ref 4.0–10.5)
nRBC: 0 % (ref 0.0–0.2)

## 2020-11-17 LAB — POCT FERN TEST: POCT Fern Test: POSITIVE

## 2020-11-17 MED ORDER — OXYTOCIN-SODIUM CHLORIDE 30-0.9 UT/500ML-% IV SOLN
2.5000 [IU]/h | INTRAVENOUS | Status: DC
  Filled 2020-11-17: qty 500

## 2020-11-17 MED ORDER — METHYLERGONOVINE MALEATE 0.2 MG/ML IJ SOLN: 0.2000 mg | INTRAMUSCULAR | Status: DC | PRN

## 2020-11-17 MED ORDER — FLEET ENEMA 7-19 GM/118ML RE ENEM: 1.0000 | ENEMA | Freq: Every day | RECTAL | Status: DC | PRN

## 2020-11-17 MED ORDER — IBUPROFEN 600 MG PO TABS
600.0000 mg | ORAL_TABLET | Freq: Four times a day (QID) | ORAL | Status: DC
  Administered 2020-11-17 – 2020-11-19 (×7): 600 mg via ORAL
  Filled 2020-11-17 (×8): qty 1

## 2020-11-17 MED ORDER — DIPHENHYDRAMINE HCL 50 MG/ML IJ SOLN: 12.5000 mg | INTRAMUSCULAR | Status: DC | PRN

## 2020-11-17 MED ORDER — LIDOCAINE HCL (PF) 1 % IJ SOLN
INTRAMUSCULAR | Status: DC | PRN
  Administered 2020-11-17 (×2): 5 mL via EPIDURAL

## 2020-11-17 MED ORDER — EPHEDRINE 5 MG/ML INJ
10.0000 mg | INTRAVENOUS | Status: DC | PRN
  Filled 2020-11-17: qty 10

## 2020-11-17 MED ORDER — ZOLPIDEM TARTRATE 5 MG PO TABS: 5.0000 mg | ORAL_TABLET | Freq: Every evening | ORAL | Status: DC | PRN

## 2020-11-17 MED ORDER — OXYTOCIN BOLUS FROM INFUSION
333.0000 mL | Freq: Once | INTRAVENOUS | Status: AC
  Administered 2020-11-17: 333 mL via INTRAVENOUS

## 2020-11-17 MED ORDER — PHENYLEPHRINE 40 MCG/ML (10ML) SYRINGE FOR IV PUSH (FOR BLOOD PRESSURE SUPPORT): 80.0000 ug | PREFILLED_SYRINGE | INTRAVENOUS | Status: DC | PRN

## 2020-11-17 MED ORDER — ONDANSETRON HCL 4 MG PO TABS: 4.0000 mg | ORAL_TABLET | ORAL | Status: DC | PRN

## 2020-11-17 MED ORDER — ACETAMINOPHEN 325 MG PO TABS: 650.0000 mg | ORAL_TABLET | ORAL | Status: DC | PRN

## 2020-11-17 MED ORDER — SENNOSIDES-DOCUSATE SODIUM 8.6-50 MG PO TABS
2.0000 | ORAL_TABLET | Freq: Every day | ORAL | Status: DC
  Administered 2020-11-18: 2 via ORAL
  Filled 2020-11-17 (×2): qty 2

## 2020-11-17 MED ORDER — ONDANSETRON HCL 4 MG/2ML IJ SOLN: 4.0000 mg | INTRAMUSCULAR | Status: DC | PRN

## 2020-11-17 MED ORDER — EPHEDRINE 5 MG/ML INJ: 10.0000 mg | INTRAVENOUS | Status: DC | PRN

## 2020-11-17 MED ORDER — LACTATED RINGERS IV SOLN: 500.0000 mL | Freq: Once | INTRAVENOUS | Status: DC

## 2020-11-17 MED ORDER — FENTANYL CITRATE (PF) 100 MCG/2ML IJ SOLN
100.0000 ug | INTRAMUSCULAR | Status: DC | PRN
  Administered 2020-11-17: 100 ug via INTRAVENOUS
  Filled 2020-11-17: qty 2

## 2020-11-17 MED ORDER — FENTANYL-BUPIVACAINE-NACL 0.5-0.125-0.9 MG/250ML-% EP SOLN
12.0000 mL/h | EPIDURAL | Status: DC | PRN
  Administered 2020-11-17: 12 mL/h via EPIDURAL
  Filled 2020-11-17: qty 250

## 2020-11-17 MED ORDER — DIBUCAINE (PERIANAL) 1 % EX OINT: 1.0000 "application " | TOPICAL_OINTMENT | CUTANEOUS | Status: DC | PRN

## 2020-11-17 MED ORDER — DIPHENHYDRAMINE HCL 25 MG PO CAPS: 25.0000 mg | ORAL_CAPSULE | Freq: Four times a day (QID) | ORAL | Status: DC | PRN

## 2020-11-17 MED ORDER — WITCH HAZEL-GLYCERIN EX PADS: 1.0000 "application " | MEDICATED_PAD | CUTANEOUS | Status: DC | PRN

## 2020-11-17 MED ORDER — SOD CITRATE-CITRIC ACID 500-334 MG/5ML PO SOLN: 30.0000 mL | ORAL | Status: DC | PRN

## 2020-11-17 MED ORDER — OXYCODONE-ACETAMINOPHEN 5-325 MG PO TABS: 1.0000 | ORAL_TABLET | ORAL | Status: DC | PRN

## 2020-11-17 MED ORDER — METHYLERGONOVINE MALEATE 0.2 MG PO TABS
0.2000 mg | ORAL_TABLET | ORAL | Status: DC | PRN
Start: 2020-11-17 — End: 2020-11-19

## 2020-11-17 MED ORDER — PRENATAL MULTIVITAMIN CH
1.0000 | ORAL_TABLET | Freq: Every day | ORAL | Status: DC
  Administered 2020-11-18: 1 via ORAL
  Filled 2020-11-17 (×2): qty 1

## 2020-11-17 MED ORDER — COCONUT OIL OIL
1.0000 "application " | TOPICAL_OIL | Status: DC | PRN
  Administered 2020-11-18: 1 via TOPICAL

## 2020-11-17 MED ORDER — BENZOCAINE-MENTHOL 20-0.5 % EX AERO
1.0000 "application " | INHALATION_SPRAY | CUTANEOUS | Status: DC | PRN
  Administered 2020-11-17: 1 via TOPICAL
  Filled 2020-11-17: qty 56

## 2020-11-17 MED ORDER — LACTATED RINGERS IV SOLN
500.0000 mL | INTRAVENOUS | Status: DC | PRN
  Administered 2020-11-17: 1000 mL via INTRAVENOUS

## 2020-11-17 MED ORDER — OXYCODONE-ACETAMINOPHEN 5-325 MG PO TABS: 2.0000 | ORAL_TABLET | ORAL | Status: DC | PRN

## 2020-11-17 MED ORDER — FERROUS SULFATE 325 (65 FE) MG PO TABS
325.0000 mg | ORAL_TABLET | Freq: Every day | ORAL | Status: DC
  Administered 2020-11-18: 325 mg via ORAL
  Filled 2020-11-17 (×2): qty 1

## 2020-11-17 MED ORDER — LACTATED RINGERS IV SOLN: INTRAVENOUS | Status: DC

## 2020-11-17 MED ORDER — SIMETHICONE 80 MG PO CHEW: 80.0000 mg | CHEWABLE_TABLET | ORAL | Status: DC | PRN

## 2020-11-17 MED ORDER — ONDANSETRON HCL 4 MG/2ML IJ SOLN: 4.0000 mg | Freq: Four times a day (QID) | INTRAMUSCULAR | Status: DC | PRN

## 2020-11-17 MED ORDER — LIDOCAINE HCL (PF) 1 % IJ SOLN: 30.0000 mL | INTRAMUSCULAR | Status: DC | PRN

## 2020-11-17 NOTE — H&P (Signed)
OB ADMISSION/ HISTORY & PHYSICAL:  Admission Date: 11/17/2020  3:19 AM  Admit Diagnosis: normal labor  Kathleen Aguilar is a 40 y.o. female G1P0 [redacted]w[redacted]d presenting for labor eval. Endorses active FM, denies LOF and vaginal bleeding. Ctx began @ midnight and continued to increase in intensity. Hx of Chronic Hep B virus.   History of current pregnancy: G1P0   Primary OB Provider: Dr. Richardson Dopp Patient entered care with [facility] at 10+1 wks.   EDC by LMP   Anatomy scan:  19 wks, complete Significant prenatal events:  Patient Active Problem List   Diagnosis Date Noted  . Normal labor 11/17/2020  . AMA (advanced maternal age) primigravida 39+, third trimester 11/17/2020  . Hepatitis B infection 06/05/2007    Prenatal Labs: ABO, Rh: --/--/A POS (05/18 0530) Antibody: NEG (05/18 0530) Rubella:   immune RPR:   NR HBsAg:   positive-chronic viral HBV w/o delta agent and w/o coma HIV:   NR GTT: passed 1 hr GBS:   neg GC/CHL: neg/neg   OB History  Gravida Para Term Preterm AB Living  1            SAB IAB Ectopic Multiple Live Births               # Outcome Date GA Lbr Len/2nd Weight Sex Delivery Anes PTL Lv  1 Current             Medical / Surgical History: Past medical history:  Past Medical History:  Diagnosis Date  . Hepatitis B infection 06/05/2007   Overview:  Hep C neg 2012.  [name]. [name], MD............  3:28 PM 07/02/2013      Past surgical history:  Past Surgical History:  Procedure Laterality Date  . LIVER BIOPSY    . WISDOM TOOTH EXTRACTION     Family History:  Family History  Problem Relation Age of Onset  . Diabetes Mother     Social History:  reports that she has never smoked. She has never used smokeless tobacco. She reports that she does not drink alcohol and does not use drugs.  Allergies: Patient has no known allergies.   Current Medications at time of admission:  Prior to Admission medications   Medication Sig Start Date End Date Taking?  Authorizing Provider  clindamycin (CLEOCIN T) 1 % lotion APPLY 1 APPLICATION ON THE SKIN IN THE MORNING 10/31/19  Yes Jarold Motto, PA  Prenatal Vit-Fe Fumarate-FA (PRENATAL VITAMIN PO) Take 1 tablet by mouth daily.   Yes [provider]  tenofovir (VIREAD) 300 MG tablet Take 300 mg by mouth daily.  06/05/07  Yes [provider]  spironolactone (ALDACTONE) 25 MG tablet Take 25 mg by mouth daily.    [provider]  tretinoin (RETIN-A) 0.1 % cream APPLY ON THE SKIN TO AFFECTED AREA DAILY AT NIGHT 10/31/19   Jarold Motto, PA  TRI-PREVIFEM 0.18/0.215/0.25 MG-35 MCG tablet TAKE 1 TABLET DAILY 03/13/19   Helane Rima, DO  Vitamin D, Cholecalciferol, 25 MCG (1000 UT) CAPS Take 1 capsule by mouth daily.    [provider]    Review of Systems: Constitutional: Negative   HENT: Negative   Eyes: Negative   Respiratory: Negative   Cardiovascular: Negative   Gastrointestinal: Negative  Genitourinary: neg for bloody show, neg for LOF   Musculoskeletal: Negative   Skin: Negative   Neurological: Negative   Endo/Heme/Allergies: Negative   Psychiatric/Behavioral: Negative    Physical Exam: VS: Blood pressure 122/77, pulse 72, SpO2  100 %. AAO x3, no signs of distress Cardiovascular: RRR Respiratory: Lung fields clear to ausculation GU/GI: Abdomen gravid, non-tender, non-distended, active FM, vertex Extremities: no edema, negative for pain, tenderness, and cords  Cervical exam:Dilation: 3 Effacement (%): 100 Exam by:: AMontez Morita RN FHR: baseline rate 135 / variability moderate / accelerations present / absent decelerations TOCO: 2-5   Prenatal Transfer Tool  Maternal Diabetes: No Genetic Screening: Normal Maternal Ultrasounds/Referrals: Normal Fetal Ultrasounds or other Referrals:  None Maternal Substance Abuse:  No Significant Maternal Medications:  None Significant Maternal Lab Results: Group B Strep negative and HBsAG  positive    Assessment: 40 y.o. G1P0 [redacted]w[redacted]d  Latent stage of labor FHR category 1 GBS neg Pain management plan: epidural   Plan:  Admit to L&D Routine admission orders Epidural PRN  Dr Connye Burkitt notified of admission and plan of care  Roma Schanz MSN, CNM 11/17/2020 7:05 AM

## 2020-11-17 NOTE — Lactation Note (Signed)
This note was copied from a baby's chart. Lactation Consultation Note  Patient Name: Kathleen Aguilar Date: 11/17/2020 Reason for consult: L&D Initial assessment;Term Age:38 hours  L&D consult with 80 minutes old infant and P1 mother. Parents are present at time of consult. Congratulated them on their newborn. Infant is latched football position to left breast. Infant suckles briefly and pops off breast. Nipples seem short shafted. Discussed STS as ideal transition for infants after birth helping with temperature, blood sugar and comfort. Talked about primal reflexes such as rooting, hands to mouth, searching for the breast among others.   Assisted with hand expression, collected ~27mL of EBM and spoonfed infant. Explained LC services availability during postpartum stay. Thanked family for their time.   Maternal Data Has patient been taught Hand Expression?: Yes Does the patient have breastfeeding experience prior to this delivery?: No  Feeding Mother's Current Feeding Choice: Breast Milk  LATCH Score Latch: Repeated attempts needed to sustain latch, nipple held in mouth throughout feeding, stimulation needed to elicit sucking reflex.  Audible Swallowing: A few with stimulation  Type of Nipple: Everted at rest and after stimulation (very short shafted)  Comfort (Breast/Nipple): Soft / non-tender  Hold (Positioning): Assistance needed to correctly position infant at breast and maintain latch.  LATCH Score: 7   Lactation Tools Discussed/Used    Interventions Interventions: Breast feeding basics reviewed;Assisted with latch;Skin to skin;Hand express;Breast massage;Expressed milk;Education  Discharge Pump: Personal WIC Program: No  Consult Status Consult Status: Follow-up Date: 11/17/20 Follow-up type: In-patient    Robynn Marcel A Higuera Ancidey 11/17/2020, 1:48 PM

## 2020-11-17 NOTE — Anesthesia Procedure Notes (Signed)
Epidural Patient location during procedure: OB Start time: 11/17/2020 8:02 AM End time: 11/17/2020 8:12 AM  Staffing Anesthesiologist: Leonides Grills, MD Performed: anesthesiologist   Preanesthetic Checklist Completed: patient identified, IV checked, site marked, risks and benefits discussed, monitors and equipment checked, pre-op evaluation and timeout performed  Epidural Patient position: sitting Prep: DuraPrep Patient monitoring: heart rate, cardiac monitor, continuous pulse ox and blood pressure Approach: midline Location: L4-L5 Injection technique: LOR air  Needle:  Needle type: Tuohy  Needle gauge: 17 G Needle length: 9 cm Needle insertion depth: 4 cm Catheter type: closed end flexible Catheter size: 19 Gauge Catheter at skin depth: 9 cm Test dose: negative and Other  Assessment Events: blood not aspirated, injection not painful, no injection resistance and negative IV test  Additional Notes Informed consent obtained prior to proceeding including risk of failure, 1% risk of PDPH, risk of minor discomfort and bruising. Discussed alternatives to epidural analgesia and patient desires to proceed.  Timeout performed pre-procedure verifying patient name, procedure, and platelet count.  Patient tolerated procedure well. Reason for block:procedure for pain

## 2020-11-17 NOTE — Progress Notes (Signed)
[id number] Pt requests epidural, Dr Bradley Ferris at [facility]. Reviewed risks. Time out performed. Pt in correct position. First dose given at 0812 Epidural infusion started at 0819.

## 2020-11-17 NOTE — Progress Notes (Signed)
   Subjective: Patient is comfortable with her epidural . SROM at 6 30 am clear fluid  Objective: BP (!) 105/59   Pulse 63   SpO2 92%  No intake/output data recorded. No intake/output data recorded.  FHT:  FHR: 125 bpm, variability: moderate,  accelerations:  Present,  decelerations:  Absent UC:   regular, every 2-3 minutes SVE:   Dilation: 7 Effacement (%): 100 Station: Plus 1 Exam by:: Dr. Richardson Dopp  Labs: Lab Results  Component Value Date   WBC 10.6 (H) 11/17/2020   HGB 14.6 11/17/2020   HCT 41.9 11/17/2020   MCV 84.5 11/17/2020   PLT 196 11/17/2020    Assessment / Plan: Spontaneous labor, progressing normally  Labor: Progressing normally Preeclampsia:  NA Fetal Wellbeing:  Category I Pain Control:  Epidural I/D:  n/a Anticipated MOD:  NSVD  Kathleen Aguilar 11/17/2020, 8:35 AM

## 2020-11-17 NOTE — MAU Note (Addendum)
Patient presented to triage with reports of contractions q 10 min. Pt denies LOF, denies vaginal bleeding and reports good fetal movement.

## 2020-11-17 NOTE — Anesthesia Postprocedure Evaluation (Signed)
Anesthesia Post Note  Patient: Kathleen Aguilar  Procedure(s) Performed: AN AD HOC LABOR EPIDURAL     Patient location during evaluation: Mother Baby Anesthesia Type: Epidural Level of consciousness: awake and alert and oriented Pain management: satisfactory to patient Vital Signs Assessment: post-procedure vital signs reviewed and stable Respiratory status: spontaneous breathing and nonlabored ventilation Cardiovascular status: stable Postop Assessment: no headache, no backache, no signs of nausea or vomiting, adequate PO intake, patient able to bend at knees and epidural receding (patient up walking) Anesthetic complications: no   No complications documented.  Last Vitals:  Vitals:   11/17/20 1555 11/17/20 1720  BP: 110/72 104/65  Pulse: 76 84  Resp: 16 16  Temp: 36.7 C 36.8 C  SpO2: 100%     Last Pain:  Vitals:   11/17/20 1720  TempSrc: Axillary  PainSc: 0-No pain   Pain Goal:                   Sallee Hogrefe

## 2020-11-17 NOTE — Anesthesia Preprocedure Evaluation (Signed)
Anesthesia Evaluation  Patient identified by MRN, date of birth, ID band Patient awake    Reviewed: Allergy & Precautions, H&P , NPO status , Patient's Chart, lab work & pertinent test results  History of Anesthesia Complications Negative for: history of anesthetic complications  Airway Mallampati: II  TM Distance: >3 FB Neck ROM: full    Dental no notable dental hx. (+) Teeth Intact   Pulmonary neg pulmonary ROS,    Pulmonary exam normal breath sounds clear to auscultation       Cardiovascular negative cardio ROS Normal cardiovascular exam Rhythm:regular Rate:Normal     Neuro/Psych negative neurological ROS  negative psych ROS   GI/Hepatic negative GI ROS, (+) Hepatitis -, B  Endo/Other  negative endocrine ROS  Renal/GU negative Renal ROS  negative genitourinary   Musculoskeletal   Abdominal   Peds  Hematology negative hematology ROS (+)   Anesthesia Other Findings   Reproductive/Obstetrics (+) Pregnancy                             Anesthesia Physical Anesthesia Plan  ASA: II  Anesthesia Plan: Epidural   Post-op Pain Management:    Induction:   PONV Risk Score and Plan:   Airway Management Planned:   Additional Equipment:   Intra-op Plan:   Post-operative Plan:   Informed Consent: I have reviewed the patients History and Physical, chart, labs and discussed the procedure including the risks, benefits and alternatives for the proposed anesthesia with the patient or authorized representative who has indicated his/her understanding and acceptance.       Plan Discussed with:   Anesthesia Plan Comments:         Anesthesia Quick Evaluation

## 2020-11-17 NOTE — Lactation Note (Signed)
This note was copied from a baby's chart. Lactation Consultation Note  Patient Name: Kathleen Aguilar [id number] Date: 11/17/2020 Reason for consult: Initial assessment;Mother's request;Primapara;1st time breastfeeding;Term Age:38 hours LC assisted latching infant in football position with breast compression and signs of milk transfer noted.  Mom to offer both breasts, STS and look for signs of milk transfer with feedings.   If unable to latch, Mom to offer EBM from hand expression and then try latching.  Mom has flat nipples that will evert with stimulation. Mom provided with breast shells to wear when she is not pumping, sleeping or nursing.  Mom also pre pump with manual 5-10 minutes before latching.   Plan 1. To feed based on cues 8-12x in24 hr period no more than 4 hrs without an attempt.          2. Mom to offer EBM via spoon or finger feeding if unable to latch.          3. I and O sheet reviewed.          4 LC brochure of inpatient and outpatient services reviewed.  Maternal Data Has patient been taught Hand Expression?: Yes Does the patient have breastfeeding experience prior to this delivery?: No  Feeding Mother's Current Feeding Choice: Breast Milk  LATCH Score Latch: Repeated attempts needed to sustain latch, nipple held in mouth throughout feeding, stimulation needed to elicit sucking reflex.  Audible Swallowing: Spontaneous and intermittent  Type of Nipple: Flat (but will evert some with stimulation)  Comfort (Breast/Nipple): Soft / non-tender  Hold (Positioning): Assistance needed to correctly position infant at breast and maintain latch.  LATCH Score: 7   Lactation Tools Discussed/Used Tools: Pump;Flanges Flange Size: 24 Breast pump type: Double-Electric Breast Pump Pump Education: Setup, frequency, and cleaning;Milk Storage Reason for Pumping: flat nipples Pumping frequency: pre pump before latching 5-10 minutes  Interventions Interventions: Breast feeding  basics reviewed;Support pillows;Education;Assisted with latch;Position options;Skin to skin;Expressed milk;Hand express;Hand pump;Breast compression;Adjust position;Pre-pump if needed  Discharge Pump: Personal WIC Program: No  Consult Status Consult Status: Follow-up Date: 11/18/20 Follow-up type: In-patient    Elasha Tess  Nicholson-Springer 11/17/2020, 6:35 PM

## 2020-11-18 LAB — CBC
HCT: 35.4 % — ABNORMAL LOW (ref 36.0–46.0)
Hemoglobin: 12.1 g/dL (ref 12.0–15.0)
MCH: 29 pg (ref 26.0–34.0)
MCHC: 34.2 g/dL (ref 30.0–36.0)
MCV: 84.9 fL (ref 80.0–100.0)
Platelets: 171 10*3/uL (ref 150–400)
RBC: 4.17 MIL/uL (ref 3.87–5.11)
RDW: 13.9 % (ref 11.5–15.5)
WBC: 13.2 10*3/uL — ABNORMAL HIGH (ref 4.0–10.5)
nRBC: 0 % (ref 0.0–0.2)

## 2020-11-18 NOTE — Progress Notes (Signed)
Postpartum Note Day #1  S:  Patient doing well.  Pain controlled.  Tolerating regular diet.   Ambulating and voiding without difficulty.   Denies fevers, chills, chest pain, SOB, N/V, or worsening bilateral LE edema.  Lochia: Minimal Infant feeding:  Breast Circumcision:  Does not desire Contraception:  NOne  O: Temp:  [97.5 F (36.4 C)-98.4 F (36.9 C)] 97.9 F (36.6 C) (05/19 0500) Pulse Rate:  [62-89] 62 (05/19 0500) Resp:  [16-20] 19 (05/19 0500) BP: (64-139)/(46-93) 88/59 (05/19 0500) SpO2:  [92 %-100 %] 98 % (05/19 0500) Gen: NAD, pleasant and cooperative CV: RRR Resp: CTAB, no wheezes/rales/rhonchi Abdomen: soft, non-distended, non-tender throughout Uterus: firm, non-tender, below umbilicus Ext: no bilateral LE edema, no bilateral calf tenderness, SCDs on and working  Labs:  Recent Labs    11/17/20 0523 11/18/20 0513  HGB 14.6 12.1    A/P: Patient is a 40 y.o. G1P1001 PPD#1 s/p SVD  - Pain well controlled  - GU: UOP is adequate - GI: Tolerating regular diet - Activity: encouraged sitting up to chair and ambulation as tolerated - DVT Prophylaxis: SCDs in bed, frequent ambulation - Labs: stable as above  Disposition:  D/C home PPD#2   Steva Ready, DO [phone] (office)

## 2020-11-18 NOTE — Lactation Note (Addendum)
This note was copied from a baby's chart. Lactation Consultation Note  Patient Name: Kathleen Aguilar Date: 11/18/2020 Reason for consult: Follow-up assessment;Mother's request;Difficult latch;1st time breastfeeding;Term;Hyperbilirubinemia (-4% weight loss, mom has inverted nipples, given breast shells, hand pump and DEBP. Mom wants help with infant having a deeper latch, mom doesnt have abraisons when infant latches well, he everts mom's nipples completey outward which is red in appearance) Age:38 hours, infant with 6 voids and 6 stools since birth. P1, 28 hour term female infant with , high intermediate billi parents current feeding choice is breast and donor breast milk . LC entered the room, infant was  was cuing to breastfeed. LC assisted mom with pillow support and aligning infant parallel to breast using the cross cradle hold position, infant latched with depth, per mom, she not having any pain with this latch, infant breastfeed for 15 minutes, swallows were observed and heard.  After latching infant at the breast, infant was given 13 mls of donor breast milk with slow flow bottle nipple ( purple and gold). Mom was using DEBP while dad supplemented infant with donor breast milk, colostrum was present in breast flanges as mom was pumping, mom will offer the EBM that she pumped before donor breast milk at the next feeding. Mom will use her comfort gels after latching infant and pumping due invert nipples everting completely outward, she knows she can apply EBM on nipples and let air dry. Mom's plan: 1- Mom will continue to latch infant according to feeding cues, 8 to 12+ times within 24 hours, STS. 2- Mom knows to call RN or LC if she needs further latch assistance. 3- After latching infant at the breast mom will offer infant her EBM first before donor breast milk after each feeding, Day 2 she will supplement with 7- 13 mls per feeding.   4- Mom knows if she doesn't latch infant at breast  on day 2 to offer infant 15-30 mls of donor breast milk.  Maternal Data    Feeding Mother's Current Feeding Choice: Breast Milk and Donor Milk  LATCH Score Latch: Grasps breast easily, tongue down, lips flanged, rhythmical sucking.  Audible Swallowing: Spontaneous and intermittent  Type of Nipple: Inverted  Comfort (Breast/Nipple): Soft / non-tender  Hold (Positioning): Assistance needed to correctly position infant at breast and maintain latch.  LATCH Score: 7   Lactation Tools Discussed/Used Tools: Shells;Pump;Comfort gels Flange Size: 24 Breast pump type: Double-Electric Breast Pump Pump Education: Setup, frequency, and cleaning;Milk Storage Reason for Pumping: To help stimulate milk supply, per mom, she is having cramping with pumping and colostrum present in breast flanges which mom was happy to see, Pumping frequency: Mom knows to pump every 3 hours for 15 minutes on inital setting.  Interventions Interventions: Assisted with latch;Skin to skin;Hand express;Breast compression;Pre-pump if needed;Adjust position;Support pillows;Position options;Expressed milk;Shells;Comfort gels;DEBP;Education  Discharge Pump: DEBP  Consult Status Consult Status: Follow-up Date: 11/19/20 Follow-up type: In-patient    Kathleen Aguilar 11/18/2020, 5:42 PM

## 2020-11-19 MED ORDER — IBUPROFEN 800 MG PO TABS: 800.0000 mg | ORAL_TABLET | Freq: Three times a day (TID) | ORAL | 1 refills | Status: DC | PRN

## 2020-11-19 NOTE — Lactation Note (Signed)
This note was copied from a baby's chart. Lactation Consultation Note  Patient Name: Boy Tennie Grussing [id number]'[id number] Date: 11/19/2020 Reason for consult: Follow-up assessment Age:38 hours   P1 mother whose infant is now 46 hours old.  This is a term baby at 39+0 weeks.  Mother has been breast feeding and supplementing with donor breast milk.  Mother had no questions/concerns related to breast feeding.  She is progressing with latching and feeding.  Her nipples were very tender and sore due to a couple of poor latches.  However, she has been doing good nipple care and they are much better today.  She has also been pumping with the DEBP and I suggested using the coconut oil to lubricate her flanges prior to pumping.  Mother interested in trying this at the next pumping session.  She will continue to feed on cue or at least 8-12 times/24 hours.  Mother is visualizing more colostrum and is feeding this back to baby.  She is aware that donor breast milk cannot be taken home.    Mother has a DEBP for home use.  She has our OP phone number for any concerns after discharge.  Father present.  Family is ready to be discharged.  Explained the discharge process to thiem and encouraged mother to use any available donor milk prior to discharge.     Maternal Data    Feeding    LATCH Score                    Lactation Tools Discussed/Used    Interventions    Discharge Discharge Education: Engorgement and breast care  Consult Status Consult Status: Complete Date: 11/19/20 Follow-up type: Call as needed    Ensley Blas R Estella Malatesta 11/19/2020, 8:11 AM

## 2020-11-19 NOTE — Discharge Summary (Signed)
Postpartum Discharge Summary  11/19/20     Patient Name: Kathleen Aguilar DOB: December 08, 1980 MRN: [id number]  Date of admission: 11/17/2020 Delivery date:11/17/2020  Delivering provider: Christophe Louis  Date of discharge: 11/19/2020  Admitting diagnosis: Normal labor [O80, Z37.9] Intrauterine pregnancy: [redacted]w[redacted]d     Secondary diagnosis:  Active Problems:   Normal labor   AMA (advanced maternal age) primigravida 60+, third trimester  Additional problems: History of chronic Hepatitis B    Discharge diagnosis: Term Pregnancy Delivered                                              Post partum procedures:None Augmentation: N/A Complications: None  Hospital course: Onset of Labor With Vaginal Delivery      Kathleen Aguilar at [redacted]w[redacted]d was admitted in Active Labor on 11/17/2020. Patient had an uncomplicated labor course as follows:  Membrane Rupture Time/Date: 6:00 AM ,11/17/2020   Delivery Method:Vaginal, Spontaneous  Episiotomy:   Lacerations:  1st degree  Patient had an uncomplicated postpartum course.  She is ambulating, tolerating a regular diet, passing flatus, and urinating well. Patient is discharged home in stable condition on 11/19/20.  Newborn Data: Birth date:11/17/2020  Birth time:12:49 PM  Gender:Female  Living status:Living  Apgars:9 ,9  Weight:3185 g   Magnesium Sulfate received: No BMZ received: No Rhophylac:N/A MMR:N/A T-DaP:Given prenatally Flu: N/A Transfusion:No  Physical exam  Vitals:   11/18/20 0500 11/18/20 1453 11/18/20 2227 11/19/20 0612  BP: (!) 88/59 105/67 (!) 91/55 101/70  Pulse: 62 67 64 68  Resp: $Remo'19 16 16 15  'mEPnY$ Temp: 97.9 F (36.6 C) 98.2 F (36.8 C) 98.5 F (36.9 C) 97.7 F (36.5 C)  TempSrc: Oral Oral Oral Oral  SpO2: 98% 99%  100%   General: alert, cooperative and no distress Lochia: appropriate Uterine Fundus: firm Incision: N/A DVT Evaluation: No evidence of DVT seen on physical exam. No cords or calf tenderness. Labs: Lab Results  Component  Value Date   WBC 13.2 (H) 11/18/2020   HGB 12.1 11/18/2020   HCT 35.4 (L) 11/18/2020   MCV 84.9 11/18/2020   PLT 171 11/18/2020   CMP Latest Ref Rng & Units 12/24/2017  Glucose 70 - 99 mg/dL -  BUN 4 - 21 8  Creatinine 0.5 - 1.1 0.8  Sodium 137 - 147 139  Potassium 3.4 - 5.3 4.7  Chloride 96 - 112 mEq/L -  CO2 19 - 32 mEq/L -  Calcium 8.4 - 10.5 mg/dL -  Total Protein 6.0 - 8.3 g/dL -  Total Bilirubin 0.2 - 1.2 mg/dL -  Alkaline Phos 39 - 117 U/L -  AST 13 - 35 26  ALT 7 - 35 14   Edinburgh Score: Edinburgh Postnatal Depression Scale Screening Tool 11/18/2020  I have been able to laugh and see the funny side of things. 0  I have looked forward with enjoyment to things. 0  I have blamed myself unnecessarily when things went wrong. 1  I have been anxious or worried for no good reason. 0  I have felt scared or panicky for no good reason. 0  Things have been getting on top of me. 1  I have been so unhappy that I have had difficulty sleeping. 0  I have felt sad or miserable. 0  I have been so unhappy that I have been crying. 0  The thought  of harming myself has occurred to me. 0  Edinburgh Postnatal Depression Scale Total 2      After visit meds:  Allergies as of 11/19/2020   No Known Allergies     Medication List    TAKE these medications   clindamycin 1 % lotion Commonly known as: CLEOCIN T APPLY 1 APPLICATION ON THE SKIN IN THE MORNING   ibuprofen 800 MG tablet Commonly known as: ADVIL Take 1 tablet (800 mg total) by mouth every 8 (eight) hours as needed.   PRENATAL VITAMIN PO Take 1 tablet by mouth daily.   spironolactone 25 MG tablet Commonly known as: ALDACTONE Take 25 mg by mouth daily.   tenofovir 300 MG tablet Commonly known as: VIREAD Take 300 mg by mouth daily.   tretinoin 0.1 % cream Commonly known as: RETIN-A APPLY ON THE SKIN TO AFFECTED AREA DAILY AT NIGHT   Tri-Previfem 0.18/0.215/0.25 MG-35 MCG tablet Generic drug: Norgestimate-Ethinyl  Estradiol Triphasic TAKE 1 TABLET DAILY   Vitamin D (Cholecalciferol) 25 MCG (1000 UT) Caps Take 1 capsule by mouth daily.        Discharge home in stable condition Infant Feeding: Breast Infant Disposition:home with mother Discharge instruction: per After Visit Summary and Postpartum booklet. Activity: Advance as tolerated. Pelvic rest for 6 weeks.  Diet: routine diet Anticipated Birth Control: None Postpartum Appointment:6 weeks Additional Postpartum F/U: None Future Appointments:No future appointments. Follow up Visit:  Follow-up Information    Christophe Louis, MD Follow up in 6 week(s).   Specialty: Obstetrics and Gynecology Why: Please keep your 6 week postpartum visit with Dr. Landry Mellow. Please call the office to make sure this is scheduled. Contact information: [address] [place] [address] [phone]                   11/19/2020 Drema Dallas, DO

## 2021-06-07 NOTE — Progress Notes (Signed)
Kathleen Aguilar is a 40 y.o. female here for lower back pain.    History of Present Illness:   Chief Complaint  Patient presents with   Back Pain    Lower left sided back pain that started several weeks ago     HPI  Patient has 14 month old baby at home and is breastfeeding.  Left sided Lower Back Pain Kathleen Aguilar presents with c/o intermittent lower back pain on her left side that has been onset for several weeks. Pt describes the pain as a dull, sometimes sharp sensation that worsens upon movement. Prior to pain Kathleen Aguilar was regularly exercising on her peloton but states her current pain doesn't feel like a muscle strain. She has taken advil at times was provided with no relief.    Denies fever, chills, frequent urination, recent injury/trauma, numbness/tingling down leg, changes in urination, or changes in BMs. Pt does admit she saw a chiropractor for her tense hips two months prior to pain beginning but doesn't believe this to be the cause of her pain.   Past Medical History:  Diagnosis Date   Hepatitis B infection 06/05/2007   Overview:  Hep C neg 2012.  [name]. [name], MD............  3:28 PM 07/02/2013       Social History   Tobacco Use   Smoking status: Never   Smokeless tobacco: Never  Substance Use Topics   Alcohol use: Never   Drug use: Never    Past Surgical History:  Procedure Laterality Date   LIVER BIOPSY     WISDOM TOOTH EXTRACTION      Family History  Problem Relation Age of Onset   Diabetes Mother     No Known Allergies  Current Medications:   Current Outpatient Medications:    clindamycin (CLEOCIN T) 1 % lotion, APPLY 1 APPLICATION ON THE SKIN IN THE MORNING, Disp: 60 mL, Rfl: 5   HEATHER 0.35 MG tablet, Take 1 tablet by mouth daily., Disp: , Rfl:    Prenatal Vit-Fe Fumarate-FA (PRENATAL VITAMIN PO), Take 1 tablet by mouth daily., Disp: , Rfl:    tenofovir (VIREAD) 300 MG tablet, Take 1 tablet by mouth daily., Disp: , Rfl:    Review of Systems:    ROS Negative unless otherwise specified per HPI. Vitals:   Vitals:   06/08/21 1110  BP: 118/80  Pulse: 77  Temp: 97.9 F (36.6 C)  TempSrc: Temporal  SpO2: 99%  Weight: 118 lb 4 oz (53.6 kg)  Height: 5\' 3"  (1.6 m)     Body mass index is 20.95 kg/m.  Physical Exam:   Physical Exam Vitals and nursing note reviewed.  Constitutional:      General: She is not in acute distress.    Appearance: She is well-developed. She is not ill-appearing or toxic-appearing.  Cardiovascular:     Rate and Rhythm: Normal rate and regular rhythm.     Pulses: Normal pulses.     Heart sounds: Normal heart sounds, S1 normal and S2 normal.  Pulmonary:     Effort: Pulmonary effort is normal.     Breath sounds: Normal breath sounds.  Musculoskeletal:     Comments: Generalized tenderness left lateral hip  Skin:    General: Skin is warm and dry.  Neurological:     Mental Status: She is alert.     GCS: GCS eye subscore is 4. GCS verbal subscore is 5. GCS motor subscore is 6.  Psychiatric:        Speech: Speech normal.  Behavior: Behavior normal. Behavior is cooperative.   Results for orders placed or performed in visit on 06/08/21  POCT urinalysis dipstick  Result Value Ref Range   Color, UA yellow    Clarity, UA clear    Glucose, UA Negative Negative   Bilirubin, UA neg    Ketones, UA neg    Spec Grav, UA <=1.005 (A) 1.010 - 1.025   Blood, UA neg    pH, UA 6.0 5.0 - 8.0   Protein, UA Negative Negative   Urobilinogen, UA 0.2 0.2 or 1.0 E.U./dL   Nitrite, UA neg    Leukocytes, UA Negative Negative   Appearance     Odor       Assessment and Plan:   Left-sided low back pain without sciatica, unspecified chronicity UA negative No red flags on exam or history, however will refer to [facility] for further evaluation -- appointment secured for today at 1p Follow up as needed  I,Havlyn C Ratchford,acting as a scribe for [employer], PA.,have documented all  relevant documentation on the behalf of Jarold Motto, PA,as directed by  Jarold Motto, PA while in the presence of Jarold Motto, [place].  I, Jarold Motto, [place], have reviewed all documentation for this visit. The documentation on 06/08/21 for the exam, diagnosis, procedures, and orders are all accurate and complete.   Jarold Motto, PA-C

## 2021-06-08 ENCOUNTER — Ambulatory Visit (INDEPENDENT_AMBULATORY_CARE_PROVIDER_SITE_OTHER): Payer: Managed Care, Other (non HMO) | Admitting: Physician Assistant

## 2021-06-08 ENCOUNTER — Ambulatory Visit (INDEPENDENT_AMBULATORY_CARE_PROVIDER_SITE_OTHER): Payer: Managed Care, Other (non HMO)

## 2021-06-08 ENCOUNTER — Other Ambulatory Visit: Payer: Self-pay

## 2021-06-08 ENCOUNTER — Encounter: Payer: Self-pay | Admitting: Physician Assistant

## 2021-06-08 ENCOUNTER — Ambulatory Visit (INDEPENDENT_AMBULATORY_CARE_PROVIDER_SITE_OTHER): Payer: Managed Care, Other (non HMO) | Admitting: Family Medicine

## 2021-06-08 VITALS — BP 118/80 | HR 77 | Ht 63.0 in | Wt 123.0 lb

## 2021-06-08 VITALS — BP 118/80 | HR 77 | Temp 97.9°F | Ht 63.0 in | Wt 118.2 lb

## 2021-06-08 DIAGNOSIS — M545 Low back pain, unspecified: Secondary | ICD-10-CM

## 2021-06-08 LAB — POCT URINALYSIS DIPSTICK
Bilirubin, UA: NEGATIVE
Blood, UA: NEGATIVE
Glucose, UA: NEGATIVE
Ketones, UA: NEGATIVE
Leukocytes, UA: NEGATIVE
Nitrite, UA: NEGATIVE
Protein, UA: NEGATIVE
Spec Grav, UA: <=1.005 — AB (ref 1.010–1.025)
Urobilinogen, UA: 0.2 E.U./dL
pH, UA: 6 (ref 5.0–8.0)

## 2021-06-08 NOTE — Patient Instructions (Signed)
It was great to see you!  A referral has been placed for you to see Dr. Evan Corey with [facility]. His location: [facility] at [place] [address] on the 1st floor.   Phone number [phone], Fax [phone].  This location is across the street from the entrance to [facility] and in the same complex as the [facility] and [facility]  Take care,  Nerine Pulse PA-C  

## 2021-06-08 NOTE — Progress Notes (Signed)
I, Philbert Riser, LAT, ATC acting as a scribe for Clementeen Graham, MD.  Subjective:    CC: L hip pain  HPI: Pt is a 40 y/o female c/o L hip pain for the past month. Pt locates pain to the proximal portion of her L buttock/SIJ area. Pt reports pain is very inconsistent and varying pain.  Pain ongoing for about a month. She had a baby 6 months ago.  She notes pain is worse with extension and better with flexion.  For example she exercises on a Peloton bike and feels good on the bike but when she does yoga especially with the dog extension based activity this reproduces pain.  She denies any radiating pain weakness or numbness.  Radiating pain: no LE numbness/tingling: no LE weakness: no Aggravates: nothing in particular, possibly trunk flex Treatments tried: heat, IBU   Pertinent review of Systems: No fevers or chills  Relevant historical information: Hepatitis B.  Otherwise healthy.  Objective:    Vitals:   06/08/21 1252  BP: 118/80  Pulse: 77  SpO2: 99%   General: Well Developed, well nourished, and in no acute distress.   MSK: L-spine: Nontender midline.  Tender palpation left lumbar paraspinal musculature. Normal lumbar motion. Pain with extension. Positive stork test when standing on her right leg pain present in left low back. Lower extremity strength is intact. Reflexes are intact. Hip nontender greater trochanter and SI joint.   Normal hip motion bilaterally.  Hip abduction and external rotation strength intact bilaterally.  Extension strength intact bilaterally.  Lab and Radiology Results Results for orders placed or performed in visit on 06/08/21 (from the past 72 hour(s))  POCT urinalysis dipstick     Status: Abnormal   Collection Time: 06/08/21 12:08 PM  Result Value Ref Range   Color, UA yellow    Clarity, UA clear    Glucose, UA Negative Negative   Bilirubin, UA neg    Ketones, UA neg    Spec Grav, UA <=1.005 (A) 1.010 - 1.025   Blood, UA neg    pH,  UA 6.0 5.0 - 8.0   Protein, UA Negative Negative   Urobilinogen, UA 0.2 0.2 or 1.0 E.U./dL   Nitrite, UA neg    Leukocytes, UA Negative Negative   Appearance     Odor     No results found.  X-ray images L-spine and left hip obtained today personally and independently interpreted  L-spine: DDD L5-S1.  No anterior listhesis present.  Lucency at pars L5-S1 question spondylolysis without spondylolisthesis. Moderate stool burden present.  Left hip: No DJD.  No acute fractures.  Normal-appearing hip and pelvis.  Await formal radiology review  Impression and Recommendations:    Assessment and Plan: 40 y.o. female with left low back pain.  Differential includes muscle dysfunction left low back likely quadratus lumborum or pars defect or developing pars stress fracture or SI joint dysfunction. Patient is a great candidate for physical therapy.  Plan refer to PT.  She lives in this area so we will refer to [facility] as this should work for her schedule and is nearby to where she lives. Also recommend TENS unit and heating pad and avoiding extension based activity if it hurts. Recheck with me in about 2 months especially if not improved.  Certainly can return sooner if needed.Marland Kitchen  PDMP not reviewed this encounter. Orders Placed This Encounter  Procedures   DG Lumbar Spine 2-3 Views    Standing Status:   Future  Number of Occurrences:   1    Standing Expiration Date:   06/08/2022    Order Specific Question:   Reason for Exam (SYMPTOM  OR DIAGNOSIS REQUIRED)    Answer:   low back pain    Order Specific Question:   Preferred imaging location?    Answer:   [facility]    Order Specific Question:   Is patient pregnant?    Answer:   No   DG HIP UNILAT W OR W/O PELVIS 2-3 VIEWS LEFT    Standing Status:   Future    Number of Occurrences:   1    Standing Expiration Date:   06/08/2022    Order Specific Question:   Reason for Exam (SYMPTOM  OR DIAGNOSIS REQUIRED)     Answer:   left hip pain    Order Specific Question:   Preferred imaging location?    Answer:   [facility]    Order Specific Question:   Is patient pregnant?    Answer:   No   Ambulatory referral to Physical Therapy    Referral Priority:   Routine    Referral Type:   Physical Medicine    Referral Reason:   Specialty Services Required    Requested Specialty:   Physical Therapy    Number of Visits Requested:   1   No orders of the defined types were placed in this encounter.   Discussed warning signs or symptoms. Please see discharge instructions. Patient expresses understanding.   The above documentation has been reviewed and is accurate and complete Clementeen Graham, M.D.

## 2021-06-08 NOTE — Patient Instructions (Addendum)
Thank you for coming in today.   Please get an Xray today before you leave   Heating pad  TENS UNIT: This is helpful for muscle pain and spasm.   Search and Purchase a TENS 7000 2nd edition at  www.tenspros.com or www.Amazon.com It should be less than $30.     TENS unit instructions: Do not shower or bathe with the unit on Turn the unit off before removing electrodes or batteries If the electrodes lose stickiness add a drop of water to the electrodes after they are disconnected from the unit and place on plastic sheet. If you continued to have difficulty, call the TENS unit company to purchase more electrodes. Do not apply lotion on the skin area prior to use. Make sure the skin is clean and dry as this will help prolong the life of the electrodes. After use, always check skin for unusual red areas, rash or other skin difficulties. If there are any skin problems, does not apply electrodes to the same area. Never remove the electrodes from the unit by pulling the wires. Do not use the TENS unit or electrodes other than as directed. Do not change electrode placement without consultating your therapist or physician. Keep 2 fingers with between each electrode. Wear time ratio is 2:1, on to off times.    For example on for 30 minutes off for 15 minutes and then on for 30 minutes off for 15 minutes   Recheck back in 2 months

## 2021-06-09 NOTE — Progress Notes (Signed)
Hip x-ray looks normal to radiology

## 2021-06-09 NOTE — Progress Notes (Signed)
Lumbar spine x-ray shows some arthritis changes at the base of your spine at L5-S1.

## 2021-06-21 ENCOUNTER — Encounter: Payer: Self-pay | Admitting: Physician Assistant

## 2021-06-21 ENCOUNTER — Other Ambulatory Visit: Payer: Self-pay | Admitting: Physician Assistant

## 2021-06-21 MED ORDER — SPIRONOLACTONE 25 MG PO TABS: 25.0000 mg | ORAL_TABLET | Freq: Every day | ORAL | 1 refills | Status: DC

## 2021-08-03 ENCOUNTER — Ambulatory Visit: Payer: Managed Care, Other (non HMO) | Admitting: Physician Assistant

## 2021-08-03 ENCOUNTER — Encounter: Payer: Self-pay | Admitting: Physician Assistant

## 2021-08-03 ENCOUNTER — Other Ambulatory Visit: Payer: Self-pay

## 2021-08-03 VITALS — BP 98/60 | HR 64 | Temp 97.6°F | Ht 63.0 in | Wt 114.5 lb

## 2021-08-03 DIAGNOSIS — R55 Syncope and collapse: Secondary | ICD-10-CM

## 2021-08-03 DIAGNOSIS — G5600 Carpal tunnel syndrome, unspecified upper limb: Secondary | ICD-10-CM | POA: Insufficient documentation

## 2021-08-03 DIAGNOSIS — N8189 Other female genital prolapse: Secondary | ICD-10-CM | POA: Insufficient documentation

## 2021-08-03 LAB — URINALYSIS, ROUTINE W REFLEX MICROSCOPIC
Bilirubin Urine: NEGATIVE
Hgb urine dipstick: NEGATIVE
Leukocytes,Ua: NEGATIVE
Nitrite: NEGATIVE
RBC / HPF: NONE SEEN (ref 0–?)
Specific Gravity, Urine: 1.015 (ref 1.000–1.030)
Total Protein, Urine: NEGATIVE
Urine Glucose: NEGATIVE
Urobilinogen, UA: 0.2 (ref 0.0–1.0)
WBC, UA: NONE SEEN (ref 0–?)
pH: 6 (ref 5.0–8.0)

## 2021-08-03 LAB — POCT URINE PREGNANCY: Preg Test, Ur: NEGATIVE

## 2021-08-03 LAB — CBC WITH DIFFERENTIAL/PLATELET
Basophils Absolute: 0 10*3/uL (ref 0.0–0.1)
Basophils Relative: 0.8 % (ref 0.0–3.0)
Eosinophils Absolute: 0.1 10*3/uL (ref 0.0–0.7)
Eosinophils Relative: 2.2 % (ref 0.0–5.0)
HCT: 41.7 % (ref 36.0–46.0)
Hemoglobin: 13.8 g/dL (ref 12.0–15.0)
Lymphocytes Relative: 31.6 % (ref 12.0–46.0)
Lymphs Abs: 1.7 10*3/uL (ref 0.7–4.0)
MCHC: 33 g/dL (ref 30.0–36.0)
MCV: 83.6 fl (ref 78.0–100.0)
Monocytes Absolute: 0.4 10*3/uL (ref 0.1–1.0)
Monocytes Relative: 6.8 % (ref 3.0–12.0)
Neutro Abs: 3.2 10*3/uL (ref 1.4–7.7)
Neutrophils Relative %: 58.6 % (ref 43.0–77.0)
Platelets: 303 10*3/uL (ref 150.0–400.0)
RBC: 4.99 Mil/uL (ref 3.87–5.11)
RDW: 12.3 % (ref 11.5–15.5)
WBC: 5.5 10*3/uL (ref 4.0–10.5)

## 2021-08-03 LAB — COMPREHENSIVE METABOLIC PANEL
ALT: 18 U/L (ref 0–35)
AST: 25 U/L (ref 0–37)
Albumin: 4.9 g/dL (ref 3.5–5.2)
Alkaline Phosphatase: 68 U/L (ref 39–117)
BUN: 13 mg/dL (ref 6–23)
CO2: 28 mEq/L (ref 19–32)
Calcium: 9.6 mg/dL (ref 8.4–10.5)
Chloride: 103 mEq/L (ref 96–112)
Creatinine, Ser: 0.73 mg/dL (ref 0.40–1.20)
GFR: 104.37 mL/min (ref >60.00)
Glucose, Bld: 68 mg/dL — ABNORMAL LOW (ref 70–99)
Potassium: 4.2 mEq/L (ref 3.5–5.1)
Sodium: 140 mEq/L (ref 135–145)
Total Bilirubin: 0.8 mg/dL (ref 0.2–1.2)
Total Protein: 7.8 g/dL (ref 6.0–8.3)

## 2021-08-03 LAB — TSH: TSH: 1.36 u[IU]/mL (ref 0.35–5.50)

## 2021-08-03 NOTE — Progress Notes (Signed)
Kathleen Aguilar is a 41 y.o. female here for a syncope.  History of Present Illness:   Chief Complaint  Patient presents with   Loss of Consciousness    Pt had an episode Saturday around 5:00 AM got up to take care of baby and next woke up in the hall way on the floor.    HPI   Syncope Kathleen Aguilar presents with concerns due to recent episode of syncope. Pt states on 07/30/21 she got up at about 5 AM to take care of her baby, when she blacked out and woke up in the hallway on the floor. Upon feeling pain in her buttocks and elbow from hitting the floor, she woke up and found she was unconscious for a brief moment.  Following this episode she didn't experience any incontinence, tongue soreness/bite marks, or lasting effects. She does not recall any head pain or feeling like she hit her head. Her husband was in the other room when this happened. She does admit that at the time she was recovering from COVID and just tested negative two days prior to today's visit. Kathleen Aguilar reports that she has been staying hydrated and has not been on any new medications despite advil to treat her past fever and COVID sx.  Upon further discussion, she admits this has happened to her before when she twisted her ankle and experienced intense pain just before blacking out. Following that episode she did follow up with her OBGYN about it and was told it was more than likely a vagal reaction. Currently she is exercising regularly and breast feeding with no complications. Denies CP, SOB, nausea, vomiting, headache, lightheadedness, dizziness, or changes in vision.    Past Medical History:  Diagnosis Date   Hepatitis B infection 06/05/2007   Overview:  Hep C neg 2012.  [name]. [name], MD............  3:28 PM 07/02/2013       Social History   Tobacco Use   Smoking status: Never   Smokeless tobacco: Never  Substance Use Topics   Alcohol use: Never   Drug use: Never    Past Surgical History:  Procedure Laterality Date    LIVER BIOPSY     WISDOM TOOTH EXTRACTION      Family History  Problem Relation Age of Onset   Diabetes Mother     No Known Allergies  Current Medications:   Current Outpatient Medications:    clindamycin (CLEOCIN T) 1 % lotion, APPLY 1 APPLICATION ON THE SKIN IN THE MORNING, Disp: 60 mL, Rfl: 5   HEATHER 0.35 MG tablet, Take 1 tablet by mouth daily., Disp: , Rfl:    Prenatal Vit-Fe Fumarate-FA (PRENATAL VITAMIN PO), Take 1 tablet by mouth daily., Disp: , Rfl:    spironolactone (ALDACTONE) 25 MG tablet, Take 1 tablet (25 mg total) by mouth daily., Disp: 90 tablet, Rfl: 1   tenofovir (VIREAD) 300 MG tablet, Take 1 tablet by mouth daily., Disp: , Rfl:    Review of Systems:   ROS Negative unless otherwise specified per HPI. Vitals:   Vitals:   08/03/21 1029  BP: 98/60  Pulse: 64  Temp: 97.6 F (36.4 C)  TempSrc: Temporal  SpO2: 100%  Weight: 114 lb 8 oz (51.9 kg)  Height: 5\' 3"  (1.6 m)     Body mass index is 20.28 kg/m.  Orthostatic VS for the past 24 hrs (Last 3 readings):  BP- Lying Pulse- Lying BP- Sitting Pulse- Sitting BP- Standing at 0 minutes Pulse- Standing at 0 minutes  08/03/21 1059 98/70 60 102/70 66 100/70 72     Physical Exam:   Physical Exam Vitals and nursing note reviewed.  Constitutional:      General: She is not in acute distress.    Appearance: She is well-developed. She is not ill-appearing or toxic-appearing.  Cardiovascular:     Rate and Rhythm: Normal rate and regular rhythm.     Pulses: Normal pulses.     Heart sounds: Normal heart sounds, S1 normal and S2 normal.  Pulmonary:     Effort: Pulmonary effort is normal.     Breath sounds: Normal breath sounds.  Skin:    General: Skin is warm and dry.  Neurological:     Mental Status: She is alert.     GCS: GCS eye subscore is 4. GCS verbal subscore is 5. GCS motor subscore is 6.  Psychiatric:        Speech: Speech normal.        Behavior: Behavior normal. Behavior is cooperative.     Assessment and Plan:   Syncope, unspecified syncope type EKG tracing is personally reviewed.  EKG notes NSR.  No acute changes.  Orthostatics negative Update labs today and urinalysis Urine preg negative  Echocardiogram and zio patch ordered for further evaluation Continue to push fluids and eat regular meals throughout the day  If new/worsening symptoms in the meantime, I asked her to reach out to [place]  I,Kathleen Aguilar,acting as a scribe for [employer], PA.,have documented all relevant documentation on the behalf of Kathleen Motto, PA,as directed by  Kathleen Motto, PA while in the presence of Kathleen Aguilar, [place].  I, Kathleen Aguilar, [place], have reviewed all documentation for this visit. The documentation on 08/03/21 for the exam, diagnosis, procedures, and orders are all accurate and complete.  Time spent with patient today was 35 minutes which consisted of chart review, discussing diagnosis, work up, treatment answering questions and documentation.   Kathleen Motto, PA-C

## 2021-08-03 NOTE — Patient Instructions (Addendum)
It was great to see you!  EKG and orthostatic vitals look normal  We will update your blood work today and urine studies today and I will be in touch via mychart with those results  You will be contacted about scheduling your continuous heart monitor and ultrasound of your heart  Continue to push fluids and eat regular meals throughout the day  If new/worsening symptoms in the meantime, please let me know  Take care,  Inda Coke PA-C

## 2021-08-04 ENCOUNTER — Ambulatory Visit (INDEPENDENT_AMBULATORY_CARE_PROVIDER_SITE_OTHER): Payer: Managed Care, Other (non HMO)

## 2021-08-04 DIAGNOSIS — R55 Syncope and collapse: Secondary | ICD-10-CM

## 2021-08-04 NOTE — Progress Notes (Unsigned)
Enrolled for [employer] to mail a ZIO XT long term holter monitor to the patients address on file.  

## 2021-08-26 ENCOUNTER — Other Ambulatory Visit: Payer: Self-pay

## 2021-08-26 ENCOUNTER — Ambulatory Visit (HOSPITAL_COMMUNITY): Payer: Managed Care, Other (non HMO) | Attending: Cardiology

## 2021-08-26 DIAGNOSIS — R55 Syncope and collapse: Secondary | ICD-10-CM | POA: Insufficient documentation

## 2021-08-26 LAB — ECHOCARDIOGRAM COMPLETE
Area-P 1/2: 3.24 cm2
S' Lateral: 2.4 cm

## 2021-09-01 ENCOUNTER — Encounter: Payer: Self-pay | Admitting: Physician Assistant

## 2021-09-15 ENCOUNTER — Other Ambulatory Visit: Payer: Self-pay

## 2021-10-21 ENCOUNTER — Ambulatory Visit: Payer: Managed Care, Other (non HMO) | Admitting: Internal Medicine

## 2021-10-21 ENCOUNTER — Encounter: Payer: Self-pay | Admitting: Internal Medicine

## 2021-10-21 VITALS — BP 110/70 | HR 71 | Ht 63.0 in | Wt 115.8 lb

## 2021-10-21 DIAGNOSIS — I472 Ventricular tachycardia, unspecified: Secondary | ICD-10-CM | POA: Diagnosis not present

## 2021-10-21 DIAGNOSIS — R55 Syncope and collapse: Secondary | ICD-10-CM | POA: Diagnosis not present

## 2021-10-21 NOTE — Progress Notes (Signed)
?Cardiology Office Note:   ? ?Date:  10/21/2021  ? ?ID:  Kathleen Aguilar, DOB 1980-09-13, MRN [id number] ? ?PCP:  Jarold Motto, PA  ?Cardiologist:  Parke Poisson, MD  ?Electrophysiologist:  None  ? ?Referring MD: Jarold Motto, PA  ? ?Chief Complaint/Reason for Referral: ?Syncope ? ?History of Present Illness:   ? ?Kathleen Aguilar is a 41 y.o. female with no cardiovascular history and chronic hepatitis B, here for the evaluation of syncope at the request of Jarold Motto, [place]. ? ?She was seen by Jarold Motto, PA on 08/03/2021 following a recent syncopal episode. She woke up in the morning to care for her baby when she suddenly blacked out and woke up on the hallway floor. She noted that she was recovering from a recent COVID infection. Echo and Zio monitor testing was ordered. She was referred to cardiology for further evaluation. She has had 2 episodes in total, once in the summer 2022 and more recently in 07/2021.  ? ?This past January she describes her syncopal episode as "she fainted, barely hit her head, and then bounced back." This had occurred seconds after waking up, as she was walking down the hall. Her episode was sudden with no prodrome. Her initial episode in the summer occurred after she twisted her ankle while walking outside. She believes she lost consciousness due to the pain. ? ?Once in a while, she wakes up from sleep "trying to catch her breath but she can breathe." She has not been told that she snores. ? ?The patient denies chest pain, chest pressure, dyspnea or with exertion, palpitations, or leg swelling. Denies cough, fever, chills. Denies nausea, vomiting. Denies dizziness or lightheadedness. ? ?She endorses chronic hepatitis B currently managed with medication therapy. Additionally she is taking spironolactone for acne. ? ?There were no complications during her recent pregnancy. No major childhood illness or maternal health issues while she was in utero. No family history of  sudden death, recurrent syncope, unexplained drowning. ? ? ?Past Medical History:  ?Diagnosis Date  ? Hepatitis B infection 06/05/2007  ? Overview:  Hep C neg 2012.  [name]. [name], MD............  3:28 PM 07/02/2013    ? ? ?Past Surgical History:  ?Procedure Laterality Date  ? LIVER BIOPSY    ? WISDOM TOOTH EXTRACTION    ? ? ?Current Medications: ?Current Meds  ?Medication Sig  ? clindamycin (CLEOCIN T) 1 % lotion APPLY 1 APPLICATION ON THE SKIN IN THE MORNING  ? HEATHER 0.35 MG tablet Take 1 tablet by mouth daily.  ? Prenatal Vit-Fe Fumarate-FA (PRENATAL VITAMIN PO) Take 1 tablet by mouth daily.  ? spironolactone (ALDACTONE) 25 MG tablet Take 1 tablet (25 mg total) by mouth daily.  ? tenofovir (VIREAD) 300 MG tablet Take 1 tablet by mouth daily.  ?  ? ?Allergies:   Patient has no known allergies.  ? ?Social History  ? ?Tobacco Use  ? Smoking status: Never  ? Smokeless tobacco: Never  ?Substance Use Topics  ? Alcohol use: Never  ? Drug use: Never  ?  ? ?Family History: ?The patient's family history includes Diabetes in her mother; Other in her paternal grandfather. ? ?ROS:   ?Please see the history of present illness.    ?(+) Syncope ?All other systems reviewed and are negative. ? ?EKGs/Labs/Other Studies Reviewed:   ? ?The following studies were reviewed today: ? ?Echocardiogram 08/26/21: ? ?Impressions: ? ?1. Left ventricular ejection fraction, by estimation, is 50 to 55%. The  ?left ventricle has  low normal function. The left ventricle has no regional  ?wall motion abnormalities. Left ventricular diastolic parameters were  ?normal.  ? 2. Right ventricular systolic function is normal. The right ventricular  ?size is normal. Tricuspid regurgitation signal is inadequate for assessing  ?PA pressure.  ? 3. The mitral valve is normal in structure. No evidence of mitral valve  ?regurgitation. No evidence of mitral stenosis.  ? 4. The aortic valve is tricuspid. Aortic valve regurgitation is not  ?visualized. No aortic  stenosis is present.  ? ?Long Term Monitor 02/23: ? ?Normal sinus rhythm average heart rate of 70 bpm. ?Rare PACs, PVCs ?Slow brief idioventricular rhythm at 10:30 PM, asymptomatic average heart rate 103 bpm -recent echo with ejection fraction 55%, overall reassuring ?Patient reported symptoms were associated with sinus rhythm. ?  ?  ?Patch Wear Time:  7 days and 0 hours (2023-02-07T16:12:32-0500 to 2023-02-14T16:38:00-0500) ?  ?Patient had a min HR of 45 bpm, max HR of 174 bpm, and avg HR of 70 bpm. Predominant underlying rhythm was Sinus Rhythm. 2 Ventricular Tachycardia runs occurred, the run with the fastest interval lasting 6 beats with a max rate of 132 bpm (avg 103 bpm);  ?the run with the fastest interval was also the longest. Isolated SVEs were rare (<1.0%), and no SVE Couplets or SVE Triplets were present. Isolated VEs were rare (<1.0%), and no VE Couplets or VE Triplets were present.  ?  ? ?EKG:  EKG is personally reviewed. ?10/21/2021: Sinus rhythm. Rate 71 bpm. No delta wave. ? ?Imaging studies that I have independently reviewed today: n/a ? ?Recent Labs: ?08/03/2021: ALT 18; BUN 13; Creatinine, Ser 0.73; Hemoglobin 13.8; Platelets 303.0; Potassium 4.2; Sodium 140; TSH 1.36  ? ?Recent Lipid Panel ?   ?Component Value Date/Time  ? CHOL 197 07/12/2017 0816  ? TRIG 61.0 07/12/2017 0816  ? HDL 70.40 07/12/2017 0816  ? CHOLHDL 3 07/12/2017 0816  ? VLDL 12.2 07/12/2017 0816  ? LDLCALC 114 (H) 07/12/2017 0816  ? ? ?Physical Exam:   ? ?VS:  BP 110/70 (BP Location: Left Arm)   Pulse 71   Ht 5\' 3"  (1.6 m)   Wt 115 lb 12.8 oz (52.5 kg)   SpO2 97%   BMI 20.51 kg/m?    ? ?Wt Readings from Last 5 Encounters:  ?10/21/21 115 lb 12.8 oz (52.5 kg)  ?08/03/21 114 lb 8 oz (51.9 kg)  ?06/08/21 123 lb (55.8 kg)  ?06/08/21 118 lb 4 oz (53.6 kg)  ?10/31/20 134 lb (60.8 kg)  ?  ?Constitutional: No acute distress ?Eyes: sclera non-icteric, normal conjunctiva and lids ?ENMT: normal dentition, moist mucous  membranes ?Cardiovascular: regular rhythm, normal rate, no murmur. S1 and S2 normal. No jugular venous distention.  ?Respiratory: clear to auscultation bilaterally ?GI : normal bowel sounds, soft and nontender. No distention.   ?MSK: extremities warm, well perfused. No edema.  ?NEURO: grossly nonfocal exam, moves all extremities. ?PSYCH: alert and oriented x 3, normal mood and affect.  ? ?ASSESSMENT:   ? ?1. V-tach [facility](HCC)   ?2. Syncope and collapse   ? ?PLAN:   ? ?V-tach (HCC) - Plan: EKG 12-Lead, Basic metabolic panel, CBC, MR CARDIAC MORPHOLOGY W WO CONTRAST, Ambulatory referral to Cardiac Electrophysiology ? ?Syncope and collapse - Plan: EKG 12-Lead, Basic metabolic panel, CBC, MR CARDIAC MORPHOLOGY W WO CONTRAST, Ambulatory referral to Cardiac Electrophysiology ? ?- idioventricular rhythm with two episodes of unexplained syncope. May be neurocardiogenic, however with abnormality on telemetry monitor we have discussed in  shared decision making strategy of observation vs tissue characterization with cardiac MRI. Will pursue cardiac MRI based on patient preference. Recent echocardiogram grossly normal. Will also plan for review with EP given young age and no definite prodrome for completeness.  ?- lab work to exclude anemia or other reversible causes. ? ?Weston Brass, MD, [facility] ?[place]  [facility]  ? ?Shared Decision Making/Informed Consent:   ?   ? ?Medication Adjustments/Labs and Tests Ordered: ?Current medicines are reviewed at length with the patient today.  Concerns regarding medicines are outlined above.  ? ?Orders Placed This Encounter  ?Procedures  ? MR CARDIAC MORPHOLOGY W WO CONTRAST  ? Basic metabolic panel  ? CBC  ? Ambulatory referral to Cardiac Electrophysiology  ? EKG 12-Lead  ? ? ?No orders of the defined types were placed in this encounter. ? ? ?Patient Instructions  ?Medication Instructions:  ?No Changes In Medications at this time.  ?*If you need a refill on your cardiac medications  before your next appointment, please call your pharmacy* ? ?Lab Work: ?BMET AND CBC TODAY  ?If you have labs (blood work) drawn today and your tests are completely normal, you will receive your results only by: ?MyChart Message (if yo

## 2021-10-21 NOTE — Patient Instructions (Signed)
Medication Instructions:  ?No Changes In Medications at this time.  ?*If you need a refill on your cardiac medications before your next appointment, please call your pharmacy* ? ?Lab Work: ?BMET AND CBC TODAY  ?If you have labs (blood work) drawn today and your tests are completely normal, you will receive your results only by: ?MyChart Message (if you have MyChart) OR ?A paper copy in the mail ?If you have any lab test that is abnormal or we need to change your treatment, we will call you to review the results. ? ?Testing/Procedures: ? ? ?You are scheduled for Cardiac MRI on ______________. Please arrive at the [facility] main entrance of [facility] at ________________ (30-45 minutes prior to test start time). ? ? ?[facility] ?[address] ?[place], [place] [address] ?[phone] ? ?Please take advantage of the free valet parking available at the MAIN entrance (A entrance). Proceed to the [facility] Radiology Department (First Floor). ?? ?Magnetic resonance imaging (MRI) is a painless test that produces images of the inside of the body without using Xrays. ? ?During an MRI, strong magnets and radio waves work together in a Data processing manager to form detailed images.  ? ?MRI images may provide more details about a medical condition than X-rays, CT scans, and ultrasounds can provide. ? ?You may be given earphones to listen for instructions. ? ?You may eat a light breakfast and take medications as ordered with the exception of HCTZ (fluid pill, other). Please avoid stimulants for 12 hr prior to test. (Ie. Caffeine, nicotine, chocolate, or antihistamine medications) ? ?If a contrast material will be used, an IV will be inserted into one of your veins. Contrast material will be injected into your IV. It will leave your body through your urine within a day. You may be told to drink plenty of fluids to help flush the contrast material out of your system. ? ?You will be asked to remove all metal,  including: Watch, jewelry, and other metal objects including hearing aids, hair pieces and dentures. Also wearable glucose monitoring systems (ie. Freestyle Libre and Omnipods) (Braces and fillings normally are not a problem.) ? ?TEST WILL TAKE APPROXIMATELY 1 HOUR ? ?PLEASE NOTIFY SCHEDULING AT LEAST 24 HOURS IN ADVANCE IF YOU ARE UNABLE TO KEEP YOUR APPOINTMENT. ?[phone] ? ?Please call Rockwell Alexandria, cardiac imaging nurse navigator with any questions/concerns. ?Rockwell Alexandria RN Navigator Cardiac Imaging ?Larey Brick RN Navigator Cardiac Imaging ?[facility] ?[phone] Office  ? ? ?Follow-Up: ?At [facility], you and your health needs are our priority.  As part of our continuing mission to provide you with exceptional heart care, we have created designated Provider Care Teams.  These Care Teams include your primary Cardiologist (physician) and Advanced Practice Providers (APPs -  Physician Assistants and Nurse Practitioners) who all work together to provide you with the care you need, when you need it. ? ?Your next appointment:   ?July 27th AT 1:40PM ? ?The format for your next appointment:   ?In Person ? ?Provider:   ?Parke Poisson, MD   ? ? ? ? ? ? ? ?

## 2021-10-25 LAB — CBC
Hematocrit: 40.7 % (ref 34.0–46.6)
Hemoglobin: 14 g/dL (ref 11.1–15.9)
MCH: 28.9 pg (ref 26.6–33.0)
MCHC: 34.4 g/dL (ref 31.5–35.7)
MCV: 84 fL (ref 79–97)
Platelets: 244 10*3/uL (ref 150–450)
RBC: 4.85 x10E6/uL (ref 3.77–5.28)
RDW: 12.5 % (ref 11.7–15.4)
WBC: 6.3 10*3/uL (ref 3.4–10.8)

## 2021-10-25 LAB — BASIC METABOLIC PANEL
BUN/Creatinine Ratio: 14 (ref 9–23)
BUN: 10 mg/dL (ref 6–20)
CO2: 25 mmol/L (ref 20–29)
Calcium: 9.7 mg/dL (ref 8.7–10.2)
Chloride: 100 mmol/L (ref 96–106)
Creatinine, Ser: 0.7 mg/dL (ref 0.57–1.00)
Glucose: 95 mg/dL (ref 70–99)
Potassium: 4.5 mmol/L (ref 3.5–5.2)
Sodium: 139 mmol/L (ref 134–144)
eGFR: 113 mL/min/{1.73_m2} (ref >59)

## 2021-11-16 ENCOUNTER — Ambulatory Visit (HOSPITAL_COMMUNITY)
Admission: RE | Admit: 2021-11-16 | Discharge: 2021-11-16 | Disposition: A | Discharge status: Home / Self Care | Payer: Managed Care, Other (non HMO) | Source: Ambulatory Visit | Attending: Internal Medicine | Admitting: Internal Medicine

## 2021-11-16 DIAGNOSIS — I472 Ventricular tachycardia, unspecified: Secondary | ICD-10-CM | POA: Diagnosis not present

## 2021-11-16 DIAGNOSIS — R55 Syncope and collapse: Secondary | ICD-10-CM | POA: Diagnosis present

## 2021-11-16 MED ORDER — GADOBUTROL 1 MMOL/ML IV SOLN
5.5000 mL | Freq: Once | INTRAVENOUS | Status: AC | PRN
  Administered 2021-11-16: 5.5 mL via INTRAVENOUS

## 2021-11-21 ENCOUNTER — Encounter: Payer: Self-pay | Admitting: Internal Medicine

## 2021-11-29 ENCOUNTER — Other Ambulatory Visit: Payer: Self-pay | Admitting: Physician Assistant

## 2022-01-03 NOTE — Progress Notes (Unsigned)
Electrophysiology Office Note:    Date:  01/04/2022   ID:  Kathleen Aguilar, DOB [birth year], MRN [id number]  PCP:  Jarold Motto, PA  [facility] Cardiologist:  Parke Poisson, MD  [facility] Electrophysiologist:  None   Referring MD: Parke Poisson, MD   Chief Complaint: syncope  History of Present Illness:    Kathleen Aguilar is a 41 y.o. female who presents for an evaluation of syncope at the request of Dr Jacques Navy. Their medical history includes chronic hepatitis B.  She tells me she has had 2 total syncopal episodes.  The first 1 occurred when she twisted her ankle and was in excruciating pain this was in the remote past.  The second episode occurred in January.  She woke up in the [place] to take care of her newborn and lost consciousness shortly after standing up from sleeping.  She does not remember any prodrome.  She woke up feeling okay.  She did not have personal injury.  She has been sick recently with COVID before that syncopal episode.     Past Medical History:  Diagnosis Date   Hepatitis B infection 06/05/2007   Overview:  Hep C neg 2012.  [name]. [name], MD............  3:28 PM 07/02/2013      Past Surgical History:  Procedure Laterality Date   LIVER BIOPSY     WISDOM TOOTH EXTRACTION      Current Medications: Current Meds  Medication Sig   clindamycin (CLEOCIN T) 1 % lotion APPLY 1 APPLICATION ON THE SKIN IN THE MORNING   HEATHER 0.35 MG tablet Take 1 tablet by mouth daily.   Prenatal Vit-Fe Fumarate-FA (PRENATAL VITAMIN PO) Take 1 tablet by mouth daily.   spironolactone (ALDACTONE) 25 MG tablet TAKE 1 TABLET DAILY   tenofovir (VIREAD) 300 MG tablet Take 1 tablet by mouth daily.     Allergies:   Patient has no known allergies.   Social History   Socioeconomic History   Marital status: Married    Spouse name: Not on file   Number of children: Not on file   Years of education: Not on file   Highest education level: Not on file   Occupational History    Employer: [employer]  Tobacco Use   Smoking status: Never   Smokeless tobacco: Never  Substance and Sexual Activity   Alcohol use: Never   Drug use: Never   Sexual activity: Not Currently  Other Topics Concern   Not on file  Social History Narrative   Works for [employer]   Married   No children   Social Determinants of Health   Financial Resource Strain: Not on file  Food Insecurity: Not on file  Transportation Needs: Not on file  Physical Activity: Not on file  Stress: Not on file  Social Connections: Not on file     Family History: The patient's family history includes Diabetes in her mother; Other in her paternal grandfather.  ROS:   Please see the history of present illness.    All other systems reviewed and are negative.  EKGs/Labs/Other Studies Reviewed:    The following studies were reviewed today:  11/21/2021 cardiac MRI  IMPRESSION: 1. Normal biventricular chamber size and function. LVEF 62%, RVEF 53%.  2. No definite delayed myocardial enhancement. No scar, fibrosis, infarct, inflammation or infiltrative process seen.    08/26/2021 Echo EF 50% RV normal  09/09/2021 zio Normal sinus rhythm average heart rate of 70 bpm. Rare PACs, PVCs Slow  brief idioventricular rhythm at 10:30 PM, asymptomatic average heart rate 103 bpm -recent echo with ejection fraction 55%, overall reassuring Patient reported symptoms were associated with sinus rhythm.    10/21/2021 ECG - QTc , sinus. No preexcitation 08/03/2021 ECG - QTc , sinus. No preexcitation    Recent Labs: 08/03/2021: ALT 18; TSH 1.36 10/25/2021: BUN 10; Creatinine, Ser 0.70; Hemoglobin 14.0; Platelets 244; Potassium 4.5; Sodium 139  Recent Lipid Panel    Component Value Date/Time   CHOL 197 07/12/2017 0816   TRIG 61.0 07/12/2017 0816   HDL 70.40 07/12/2017 0816   CHOLHDL 3 07/12/2017 0816   VLDL 12.2 07/12/2017 0816   LDLCALC 114 (H) 07/12/2017 0816     Physical Exam:    VS:  BP 110/72   Pulse 81   Ht 5\' 3"  (1.6 m)   Wt 115 lb (52.2 kg)   SpO2 100%   BMI 20.37 kg/m     Wt Readings from Last 3 Encounters:  01/04/22 115 lb (52.2 kg)  10/21/21 115 lb 12.8 oz (52.5 kg)  08/03/21 114 lb 8 oz (51.9 kg)     GEN:  Well nourished, well developed in no acute distress HEENT: Normal NECK: No JVD; No carotid bruits LYMPHATICS: No lymphadenopathy CARDIAC: RRR, no murmurs, rubs, gallops RESPIRATORY:  Clear to auscultation without rales, wheezing or rhonchi  ABDOMEN: Soft, non-tender, non-distended MUSCULOSKELETAL:  No edema; No deformity  SKIN: Warm and dry NEUROLOGIC:  Alert and oriented x 3 PSYCHIATRIC:  Normal affect       ASSESSMENT:    1. Syncope and collapse   2. Chronic viral hepatitis B without delta agent and without coma (HCC)    PLAN:    In order of problems listed above:  #Syncope I suspect her syncopal episode was secondary to orthostatic intolerance.  There may be an element of autonomic instability that contributed given her recent COVID infection.  I do not think a malignant arrhythmia is the most likely diagnosis especially given her work-up thus far.  I recommended that she maintain adequate hydration.  If she were to have recurrent episodes in the future, would favor a loop recorder.  I did discuss this with the patient during today's visit.  She should continue to exercise aerobically.  Follow-up as needed.  Note sent to Dr. 10/01/21 Medication Adjustments/Labs and Tests Ordered: Current medicines are reviewed at length with the patient today.  Concerns regarding medicines are outlined above.  No orders of the defined types were placed in this encounter.  No orders of the defined types were placed in this encounter.    Signed, Jacques Navy. Rossie Muskrat, MD, [facility], [facility] 01/04/2022 2:51 PM    Electrophysiology [facility]

## 2022-01-04 ENCOUNTER — Ambulatory Visit: Payer: Managed Care, Other (non HMO) | Admitting: Cardiology

## 2022-01-04 ENCOUNTER — Encounter: Payer: Self-pay | Admitting: Cardiology

## 2022-01-04 VITALS — BP 110/72 | HR 81 | Ht 63.0 in | Wt 115.0 lb

## 2022-01-04 DIAGNOSIS — B181 Chronic viral hepatitis B without delta-agent: Secondary | ICD-10-CM

## 2022-01-04 DIAGNOSIS — R55 Syncope and collapse: Secondary | ICD-10-CM | POA: Diagnosis not present

## 2022-01-04 NOTE — Patient Instructions (Signed)
Medication Instructions:  Your physician recommends that you continue on your current medications as directed. Please refer to the Current Medication list given to you today. *If you need a refill on your cardiac medications before your next appointment, please call your pharmacy*  Lab Work: None. If you have labs (blood work) drawn today and your tests are completely normal, you will receive your results only by: MyChart Message (if you have MyChart) OR A paper copy in the mail If you have any lab test that is abnormal or we need to change your treatment, we will call you to review the results.  Testing/Procedures: None.  Follow-Up: At [facility], you and your health needs are our priority.  As part of our continuing mission to provide you with exceptional heart care, we have created designated Provider Care Teams.  These Care Teams include your primary Cardiologist (physician) and Advanced Practice Providers (APPs -  Physician Assistants and Nurse Practitioners) who all work together to provide you with the care you need, when you need it.  Your physician wants you to follow-up in: As needed with Cameron Lambert, MD   We recommend signing up for the patient portal called "MyChart".  Sign up information is provided on this After Visit Summary.  MyChart is used to connect with patients for Virtual Visits (Telemedicine).  Patients are able to view lab/test results, encounter notes, upcoming appointments, etc.  Non-urgent messages can be sent to your provider as well.   To learn more about what you can do with MyChart, go to [email or web address]://www.mychart.com.    Any Other Special Instructions Will Be Listed Below (If Applicable).         

## 2022-01-09 ENCOUNTER — Encounter: Payer: Self-pay | Admitting: Physician Assistant

## 2022-01-09 ENCOUNTER — Ambulatory Visit: Payer: Managed Care, Other (non HMO) | Admitting: Physician Assistant

## 2022-01-09 VITALS — BP 90/60 | HR 72 | Temp 98.0°F | Ht 63.0 in | Wt 116.0 lb

## 2022-01-09 DIAGNOSIS — R591 Generalized enlarged lymph nodes: Secondary | ICD-10-CM | POA: Diagnosis not present

## 2022-01-09 LAB — CBC WITH DIFFERENTIAL/PLATELET
Basophils Absolute: 0 10*3/uL (ref 0.0–0.1)
Basophils Relative: 0.6 % (ref 0.0–3.0)
Eosinophils Absolute: 0.1 10*3/uL (ref 0.0–0.7)
Eosinophils Relative: 2.2 % (ref 0.0–5.0)
HCT: 42 % (ref 36.0–46.0)
Hemoglobin: 14 g/dL (ref 12.0–15.0)
Lymphocytes Relative: 31.3 % (ref 12.0–46.0)
Lymphs Abs: 1.3 10*3/uL (ref 0.7–4.0)
MCHC: 33.5 g/dL (ref 30.0–36.0)
MCV: 85.5 fl (ref 78.0–100.0)
Monocytes Absolute: 0.5 10*3/uL (ref 0.1–1.0)
Monocytes Relative: 10.7 % (ref 3.0–12.0)
Neutro Abs: 2.4 10*3/uL (ref 1.4–7.7)
Neutrophils Relative %: 55.2 % (ref 43.0–77.0)
Platelets: 254 10*3/uL (ref 150.0–400.0)
RBC: 4.91 Mil/uL (ref 3.87–5.11)
RDW: 12.8 % (ref 11.5–15.5)
WBC: 4.3 10*3/uL (ref 4.0–10.5)

## 2022-01-09 LAB — COMPREHENSIVE METABOLIC PANEL
ALT: 18 U/L (ref 0–35)
AST: 28 U/L (ref 0–37)
Albumin: 4.9 g/dL (ref 3.5–5.2)
Alkaline Phosphatase: 56 U/L (ref 39–117)
BUN: 11 mg/dL (ref 6–23)
CO2: 28 mEq/L (ref 19–32)
Calcium: 9.7 mg/dL (ref 8.4–10.5)
Chloride: 102 mEq/L (ref 96–112)
Creatinine, Ser: 0.64 mg/dL (ref 0.40–1.20)
GFR: 111.81 mL/min (ref >60.00)
Glucose, Bld: 86 mg/dL (ref 70–99)
Potassium: 4.4 mEq/L (ref 3.5–5.1)
Sodium: 138 mEq/L (ref 135–145)
Total Bilirubin: 1 mg/dL (ref 0.2–1.2)
Total Protein: 7.8 g/dL (ref 6.0–8.3)

## 2022-01-09 LAB — C-REACTIVE PROTEIN: CRP: <1 mg/dL (ref 0.5–20.0)

## 2022-01-09 LAB — SEDIMENTATION RATE: Sed Rate: 18 mm/hr (ref 0–20)

## 2022-01-09 NOTE — Progress Notes (Signed)
Kathleen Aguilar is a 41 y.o. female here for a new problem.  History of Present Illness:   Chief Complaint  Patient presents with   Adenopathy    Pt noticed swollen lymph nodes right side of neck x 3 weeks, have decreased in size, no pain.    HPI  Lymphadenopathy Patient complains of swollen lymph nodes in her neck for the past 3 weeks or more. The  recently discovered few lymph nodes in her neck have been decreasing in size. Out of concern, she decided to have this evaluated to make sure nothing more significant is occurring. Denies pain, fever or chills. No night sweats. No recent vaccines. Denies any other area with lymph nodes. No cough. No family hx of cancer other than ovarian CA in [place]. Denies unintentional weight changes.  Past Medical History:  Diagnosis Date   Hepatitis B infection 06/05/2007   Overview:  Hep C neg 2012.  [name]. [name], MD............  3:28 PM 07/02/2013       Social History   Tobacco Use   Smoking status: Never   Smokeless tobacco: Never  Substance Use Topics   Alcohol use: Never   Drug use: Never    Past Surgical History:  Procedure Laterality Date   LIVER BIOPSY     WISDOM TOOTH EXTRACTION      Family History  Problem Relation Age of Onset   Diabetes Mother    Other Paternal Grandfather        possible heart issue    No Known Allergies  Current Medications:   Current Outpatient Medications:    clindamycin (CLEOCIN T) 1 % lotion, APPLY 1 APPLICATION ON THE SKIN IN THE MORNING, Disp: 60 mL, Rfl: 5   HEATHER 0.35 MG tablet, Take 1 tablet by mouth daily., Disp: , Rfl:    Prenatal Vit-Fe Fumarate-FA (PRENATAL VITAMIN PO), Take 1 tablet by mouth daily., Disp: , Rfl:    spironolactone (ALDACTONE) 25 MG tablet, TAKE 1 TABLET DAILY, Disp: 90 tablet, Rfl: 3   tenofovir (VIREAD) 300 MG tablet, Take 1 tablet by mouth daily., Disp: , Rfl:    Review of Systems:   ROS Negative unless otherwise specified per HPI.   Vitals:   Vitals:    01/09/22 1115  BP: 90/60  Pulse: 72  Temp: 98 F (36.7 C)  TempSrc: Temporal  SpO2: 100%  Weight: 116 lb (52.6 kg)  Height: 5\' 3"  (1.6 m)     Body mass index is 20.55 kg/m.  Physical Exam:   Physical Exam Vitals and nursing note reviewed.  Constitutional:      General: She is not in acute distress.    Appearance: She is well-developed. She is not ill-appearing or toxic-appearing.  HENT:     Head: Normocephalic and atraumatic.     Right Ear: Tympanic membrane, ear canal and external ear normal. Tympanic membrane is not erythematous, retracted or bulging.     Left Ear: Tympanic membrane, ear canal and external ear normal. Tympanic membrane is not erythematous, retracted or bulging.     Nose: Nose normal.     Right Sinus: No maxillary sinus tenderness or frontal sinus tenderness.     Left Sinus: No maxillary sinus tenderness or frontal sinus tenderness.     Mouth/Throat:     Pharynx: Uvula midline. No posterior oropharyngeal erythema.  Eyes:     General: Lids are normal.     Conjunctiva/sclera: Conjunctivae normal.  Neck:     Trachea: Trachea normal.  Cardiovascular:  Rate and Rhythm: Normal rate and regular rhythm.     Heart sounds: Normal heart sounds, S1 normal and S2 normal.  Pulmonary:     Effort: Pulmonary effort is normal.     Breath sounds: Normal breath sounds. No decreased breath sounds, wheezing, rhonchi or rales.  Lymphadenopathy:     Cervical: Cervical adenopathy present.     Right cervical: Superficial cervical adenopathy present.  Skin:    General: Skin is warm and dry.  Neurological:     Mental Status: She is alert.  Psychiatric:        Speech: Speech normal.        Behavior: Behavior normal. Behavior is cooperative.     Assessment and Plan:   Lymphadenopathy Due to duration of symptoms and patient's level of concern, will go ahead and order u/s and blood work. Follow-up based on results Encouraged patient to avoid touching the area of  concern  I,Savera Zaman,acting as a scribe for [employer], PA.,have documented all relevant documentation on the behalf of Jarold Motto, PA,as directed by  Jarold Motto, PA while in the presence of Jarold Motto, [place].   I, Jarold Motto, [place], have reviewed all documentation for this visit. The documentation on 01/09/22 for the exam, diagnosis, procedures, and orders are all accurate and complete.   Jarold Motto, PA-C

## 2022-01-09 NOTE — Patient Instructions (Signed)
It was great to see you!  We are going to get an ultrasound and labs to evaluate your lymphnode.  If you do not hear anything from [facility] in the next few days, please call them:  [phone]  Take care,  Jarold Motto PA-C

## 2022-01-10 ENCOUNTER — Ambulatory Visit
Admission: RE | Admit: 2022-01-10 | Discharge: 2022-01-10 | Disposition: A | Discharge status: Home / Self Care | Payer: Managed Care, Other (non HMO) | Source: Ambulatory Visit | Attending: Physician Assistant | Admitting: Physician Assistant

## 2022-01-10 DIAGNOSIS — R591 Generalized enlarged lymph nodes: Secondary | ICD-10-CM

## 2022-01-11 ENCOUNTER — Ambulatory Visit: Payer: Managed Care, Other (non HMO) | Admitting: Physician Assistant

## 2022-01-26 ENCOUNTER — Ambulatory Visit: Payer: Managed Care, Other (non HMO) | Admitting: Internal Medicine

## 2022-02-08 ENCOUNTER — Encounter (INDEPENDENT_AMBULATORY_CARE_PROVIDER_SITE_OTHER): Payer: Self-pay

## 2022-03-27 ENCOUNTER — Encounter: Payer: Self-pay | Admitting: *Deleted

## 2022-04-03 ENCOUNTER — Other Ambulatory Visit: Payer: Self-pay | Admitting: Gastroenterology

## 2022-04-03 DIAGNOSIS — B181 Chronic viral hepatitis B without delta-agent: Secondary | ICD-10-CM

## 2022-04-03 LAB — CBC AND DIFFERENTIAL
HCT: 42 (ref 36–46)
Hemoglobin: 13.9 (ref 12.0–16.0)
Platelets: 262 10*3/uL (ref 150–400)
WBC: 4.9

## 2022-04-03 LAB — HEPATITIS B SURFACE ANTIGEN: Hepatitis B Surface Ag: POSITIVE

## 2022-04-03 LAB — CBC: RBC: 4.87 (ref 3.87–5.11)

## 2022-04-12 ENCOUNTER — Ambulatory Visit
Admission: RE | Admit: 2022-04-12 | Discharge: 2022-04-12 | Disposition: A | Discharge status: Home / Self Care | Payer: Managed Care, Other (non HMO) | Source: Ambulatory Visit | Attending: Gastroenterology | Admitting: Gastroenterology

## 2022-04-12 DIAGNOSIS — B181 Chronic viral hepatitis B without delta-agent: Secondary | ICD-10-CM

## 2022-04-20 ENCOUNTER — Encounter: Payer: Self-pay | Admitting: Physician Assistant

## 2022-06-15 ENCOUNTER — Encounter: Payer: Self-pay | Admitting: *Deleted

## 2022-09-01 ENCOUNTER — Other Ambulatory Visit: Payer: Self-pay | Admitting: Physician Assistant

## 2022-09-01 ENCOUNTER — Encounter: Payer: Self-pay | Admitting: Physician Assistant

## 2022-09-01 ENCOUNTER — Ambulatory Visit: Payer: Managed Care, Other (non HMO) | Admitting: Physician Assistant

## 2022-09-01 VITALS — BP 120/70 | HR 71 | Temp 98.0°F | Ht 63.0 in | Wt 114.0 lb

## 2022-09-01 DIAGNOSIS — N644 Mastodynia: Secondary | ICD-10-CM

## 2022-09-01 NOTE — Patient Instructions (Addendum)
It was great to see you!  We are ordering breast mammogram -- [facility] may change this to an ultrasound -- they will contact me for these orders.  You can call them today or wait for their call.  Take care,  Inda Coke PA-C

## 2022-09-01 NOTE — Progress Notes (Signed)
Kathleen Aguilar is a 42 y.o. female here for a new problem.  History of Present Illness:   Chief Complaint  Patient presents with   Breast Problem    Pt c/o left breast discomfort, feeling twinges for the past 1.5 weeks, sometimes on the right breast too. No discharge from nipples.    HPI  Left Breast Patient is complaining of intermittent uncomfortable throbbing in her left breast occurring for a week and half. The sensation first appeared in her left breast, then travelled to right, but is mainly affecting her left breast. She states that this has not happened previously. It is currently not happening this visit, but last occurred a week ago. Patient mentions that the sensation can occasionally be sharp. She admits to drinking excessive caffeine and that she does not perform regular self breast exams, but does complete them occasionally. She denies pain, hx of breast cancer, and change in caffeine intake.    Past Medical History:  Diagnosis Date   Hepatitis B infection 06/05/2007   Overview:  Hep C neg 2012.  Marcy Panning. Rayburn Go, MD............  3:28 PM 07/02/2013       Social History   Tobacco Use   Smoking status: Never   Smokeless tobacco: Never  Substance Use Topics   Alcohol use: Never   Drug use: Never    Past Surgical History:  Procedure Laterality Date   LIVER BIOPSY     WISDOM TOOTH EXTRACTION      Family History  Problem Relation Age of Onset   Diabetes Mother    Other Paternal Grandfather        possible heart issue    No Known Allergies  Current Medications:   Current Outpatient Medications:    clindamycin (CLEOCIN T) 1 % lotion, APPLY 1 APPLICATION ON THE SKIN IN THE MORNING, Disp: 60 mL, Rfl: 5   spironolactone (ALDACTONE) 25 MG tablet, TAKE 1 TABLET DAILY, Disp: 90 tablet, Rfl: 3   tenofovir (VIREAD) 300 MG tablet, Take 1 tablet by mouth daily., Disp: , Rfl:    tretinoin (RETIN-A) 0.1 % cream, Apply 1 Application topically at bedtime., Disp: , Rfl:     Review of Systems:   ROS Negative unless otherwise specified per HPI.  Vitals:   Vitals:   09/01/22 0820  BP: 120/70  Pulse: 71  Temp: 98 F (36.7 C)  TempSrc: Temporal  SpO2: 98%  Weight: 114 lb (51.7 kg)  Height: '5\' 3"'$  (1.6 m)     Body mass index is 20.19 kg/m.  Physical Exam:   Physical Exam Constitutional:      General: She is not in acute distress.    Appearance: Normal appearance. She is not ill-appearing.  HENT:     Head: Normocephalic and atraumatic.     Right Ear: External ear normal.     Left Ear: External ear normal.  Eyes:     Extraocular Movements: Extraocular movements intact.     Pupils: Pupils are equal, round, and reactive to light.  Cardiovascular:     Rate and Rhythm: Normal rate and regular rhythm.     Heart sounds: Normal heart sounds. No murmur heard.    No gallop.  Pulmonary:     Effort: Pulmonary effort is normal. No respiratory distress.     Breath sounds: Normal breath sounds. No wheezing or rales.  Chest:     Chest wall: No mass or lacerations.  Breasts:    Right: No swelling, mass or tenderness.  Left: No swelling, mass or tenderness.  Skin:    General: Skin is warm and dry.  Neurological:     Mental Status: She is alert and oriented to person, place, and time.  Psychiatric:        Judgment: Judgment normal.     Assessment and Plan:   Breast pain No obvious findings on my exam Referral for imaging Follow-up based on results  I,Verona Buck,acting as a scribe for Inda Coke, PA.,have documented all relevant documentation on the behalf of Inda Coke, PA,as directed by  Inda Coke, PA while in the presence of Inda Coke, [place].  I, Inda Coke, [place], have reviewed all documentation for this visit. The documentation on 09/01/22 for the exam, diagnosis, procedures, and orders are all accurate and complete.   Inda Coke, PA-C

## 2022-09-14 ENCOUNTER — Encounter: Payer: Self-pay | Admitting: Physician Assistant

## 2022-09-15 NOTE — Telephone Encounter (Signed)
Kathleen Aguilar, please look into this. Thanks

## 2022-09-18 ENCOUNTER — Ambulatory Visit
Admission: RE | Admit: 2022-09-18 | Discharge: 2022-09-18 | Disposition: A | Discharge status: Home / Self Care | Payer: Managed Care, Other (non HMO) | Source: Ambulatory Visit | Attending: Physician Assistant | Admitting: Physician Assistant

## 2022-09-18 ENCOUNTER — Ambulatory Visit: Payer: Managed Care, Other (non HMO)

## 2022-09-18 ENCOUNTER — Ambulatory Visit
Admission: RE | Admit: 2022-09-18 | Discharge: 2022-09-18 | Disposition: A | Discharge status: Home / Self Care | Payer: Managed Care, Other (non HMO) | Source: Ambulatory Visit | Attending: Physician Assistant

## 2022-09-18 DIAGNOSIS — N644 Mastodynia: Secondary | ICD-10-CM

## 2022-11-11 ENCOUNTER — Other Ambulatory Visit: Payer: Self-pay | Admitting: Physician Assistant

## 2022-11-15 IMAGING — MR MR CARD MORPHOLOGY WO/W CM
45 of 48 series · 45 of 48 positions shown · IV contrast (Contrast agent)
Comparison: Echocardiogram 08/26/21

EXAM:
CARDIAC MRI

CLINICAL DATA: Ventricular tachycardia (VT), nonsustained
Syncope/presyncope, cardiac cause suspected
TECHNIQUE: The patient was scanned on a 1.5 Tesla GE magnet. A dedicated
cardiac coil was used. Functional imaging was done using [employer]
sequences. [DATE], and 4 chamber views were done to assess for RWMA's.
Modified Tiger rule using a short axis stack was used to
calculate an ejection fraction on a dedicated work station using
[employer] software. The patient received 5.5mL GADAVIST GADOBUTROL 1
MMOL/ML IV SOLN. After 10 minutes inversion recovery sequences were
used to assess for infiltration and scar tissue.

[Series 4: t2_haste_[id number]_bh · axial · 8.0mm · 1.41mm/px · 1 of 16 slices shown]
[im 1/16]
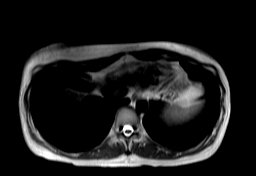

[Series 9: bSSFP · oblique · 8.0mm · 1.61mm/px · 1 of 25 slices shown (1 of 17)]
[im 1/25]
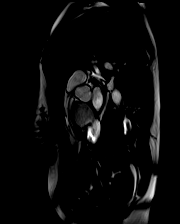

[Series 10: bSSFP · oblique · 8.0mm · 1.61mm/px · 1 of 25 slices shown (2 of 17)]
[im 1/25]
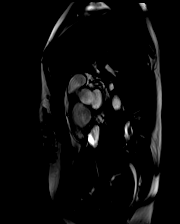

[Series 11: bSSFP · oblique · 8.0mm · 1.61mm/px · 1 of 25 slices shown (3 of 17)]
[im 1/25]
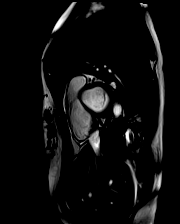

[Series 12: bSSFP · oblique · 8.0mm · 1.61mm/px · 1 of 25 slices shown (4 of 17)]
[im 1/25]
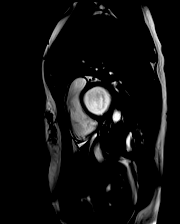

[Series 13: bSSFP · oblique · 8.0mm · 1.61mm/px · 1 of 25 slices shown (5 of 17)]
[im 1/25]
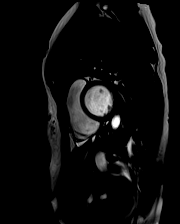

[Series 14: bSSFP · oblique · 8.0mm · 1.61mm/px · 1 of 25 slices shown (6 of 17)]
[im 1/25]
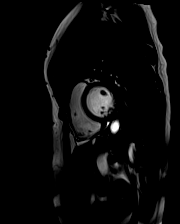

[Series 15: bSSFP · oblique · 8.0mm · 1.61mm/px · 1 of 25 slices shown (7 of 17)]
[im 1/25]
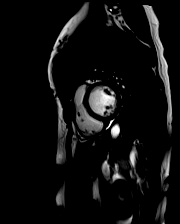

[Series 16: bSSFP · oblique · 8.0mm · 1.61mm/px · 1 of 25 slices shown (8 of 17)]
[im 1/25]
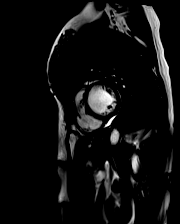

[Series 17: bSSFP · oblique · 8.0mm · 1.61mm/px · 1 of 25 slices shown (9 of 17)]
[im 1/25]
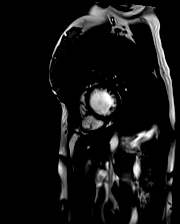

[Series 18: bSSFP · oblique · 8.0mm · 1.61mm/px · 1 of 25 slices shown (10 of 17)]
[im 1/25]
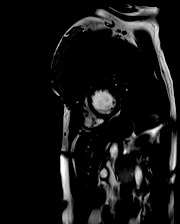

[Series 19: bSSFP · oblique · 8.0mm · 1.61mm/px · 1 of 25 slices shown (11 of 17)]
[im 1/25]
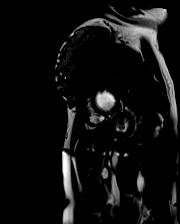

[Series 20: bSSFP · oblique · 8.0mm · 1.61mm/px · 1 of 25 slices shown (12 of 17)]
[im 1/25]
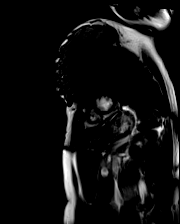

[Series 21: bSSFP · oblique · 8.0mm · 1.61mm/px · 1 of 25 slices shown (13 of 17)]
[im 1/25]
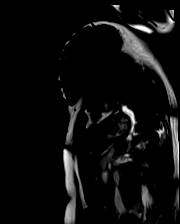

[Series 22: bSSFP · oblique · 8.0mm · 1.61mm/px · 1 of 25 slices shown (14 of 17)]
[im 1/25]
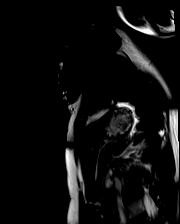

[Series 23: bSSFP · oblique · 6.0mm · 1.41mm/px · 1 of 25 slices shown (15 of 17)]
[im 1/25]
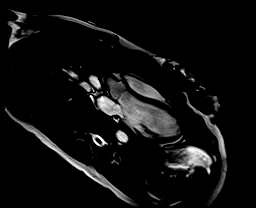

[Series 24: bSSFP · coronal · 6.0mm · 1.41mm/px · 1 of 25 slices shown (16 of 17)]
[im 1/25]
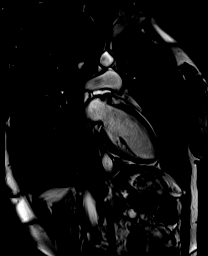

[Series 26: bSSFP · coronal · 6.0mm · 1.41mm/px · 1 of 25 slices shown (17 of 17)]
[im 1/25]
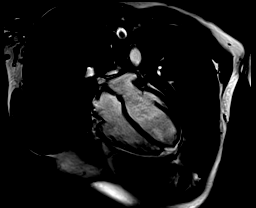

[Series 37: av cine stack · oblique · 6.0mm · 1.41mm/px · 1 of 22 slices shown (1 of 8)]
[im 1/22]
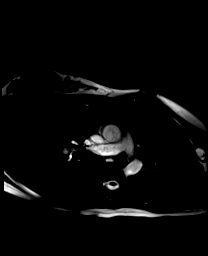

[Series 38: av cine stack · oblique · 6.0mm · 1.41mm/px · 1 of 22 slices shown (2 of 8)]
[im 1/22]
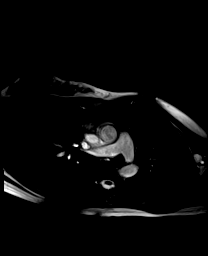

[Series 39: av cine stack · oblique · 6.0mm · 1.41mm/px · 1 of 22 slices shown (3 of 8)]
[im 1/22]
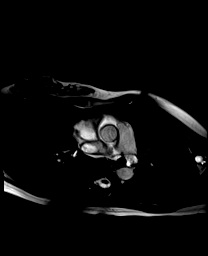

[Series 40: av cine stack · oblique · 6.0mm · 1.41mm/px · 1 of 22 slices shown (4 of 8)]
[im 1/22]
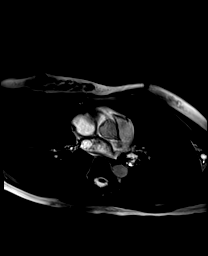

[Series 41: av cine stack · oblique · 6.0mm · 1.41mm/px · 1 of 22 slices shown (5 of 8)]
[im 1/22]
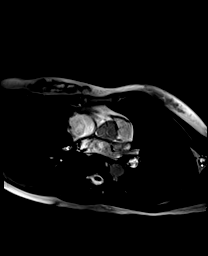

[Series 42: av cine stack · oblique · 6.0mm · 1.41mm/px · 1 of 22 slices shown (6 of 8)]
[im 1/22]
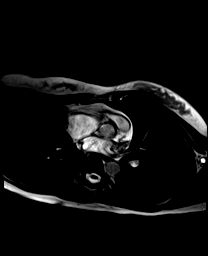

[Series 43: av cine stack · oblique · 6.0mm · 1.41mm/px · 1 of 22 slices shown (7 of 8)]
[im 1/22]
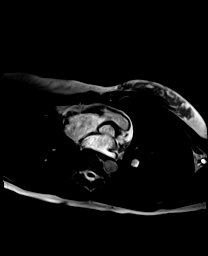

[Series 44: av cine stack · oblique · 6.0mm · 1.41mm/px · 1 of 22 slices shown (8 of 8)]
[im 1/22]
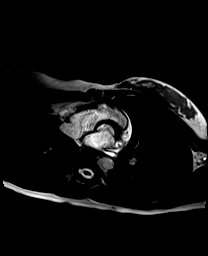

[Series 45: (id)_trufi · oblique · 8.0mm · 2.08mm/px · 1 of 9 slices shown]
[im 1/9]
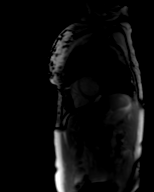

[Series 46: (id)_trufi_moco · oblique · 8.0mm · 2.08mm/px · 1 of 9 slices shown]
[im 1/9]
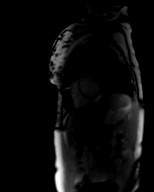

[Series 47: (id)_trufi_moco_t2 · oblique · 8.0mm · 2.08mm/px · 1 of 3 slices shown]
[im 1/3]
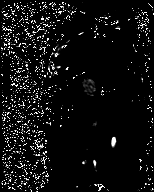

[Series 49: (id)_long_t1 · oblique · 8.0mm · 1.56mm/px · 1 of 24 slices shown]
[im 1/24]
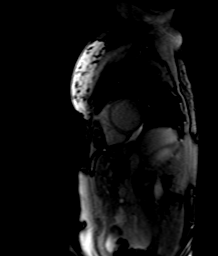

[Series 50: (id)_long_t1_moco · oblique · 8.0mm · 1.56mm/px · 1 of 24 slices shown]
[im 1/24]
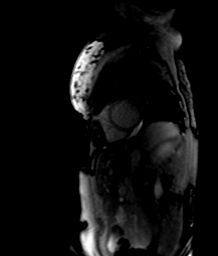

[Series 51: (id)_long_t1_moco_t1 · oblique · 8.0mm · 1.56mm/px · 1 of 3 slices shown]
[im 1/3]
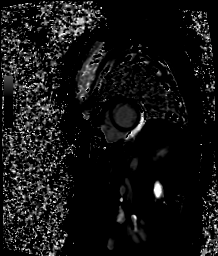

[Series 53: cine_trufi_short axis_cs_2_shot · oblique · 8.0mm · 1.48mm/px · 1 of 20 slices shown (1 of 13)]
[im 1/20]
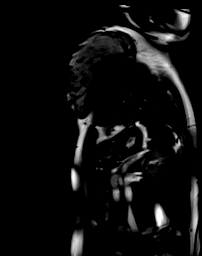

[Series 53: cine_trufi_short axis_cs_2_shot · oblique · 8.0mm · 1.48mm/px · 1 of 20 slices shown (2 of 13)]
[im 1/20]
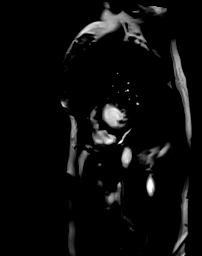

[Series 53: cine_trufi_short axis_cs_2_shot · oblique · 8.0mm · 1.48mm/px · 1 of 20 slices shown (3 of 13)]
[im 1/20]
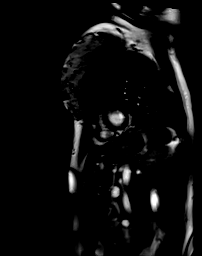

[Series 53: cine_trufi_short axis_cs_2_shot · oblique · 8.0mm · 1.48mm/px · 1 of 20 slices shown (4 of 13)]
[im 1/20]
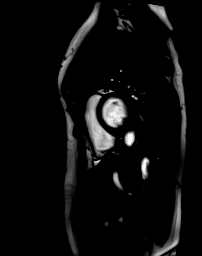

[Series 53: cine_trufi_short axis_cs_2_shot · oblique · 8.0mm · 1.48mm/px · 1 of 20 slices shown (5 of 13)]
[im 1/20]
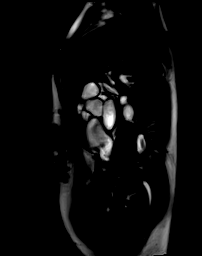

[Series 53: cine_trufi_short axis_cs_2_shot · oblique · 8.0mm · 1.48mm/px · 1 of 20 slices shown (6 of 13)]
[im 1/20]
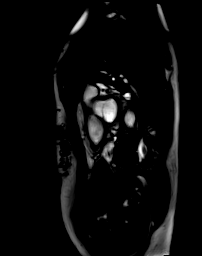

[Series 53: cine_trufi_short axis_cs_2_shot · oblique · 8.0mm · 1.48mm/px · 1 of 20 slices shown (7 of 13)]
[im 1/20]
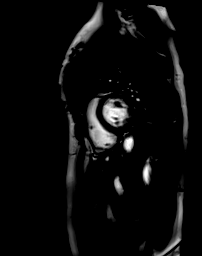

[Series 53: cine_trufi_short axis_cs_2_shot · oblique · 8.0mm · 1.48mm/px · 1 of 20 slices shown (8 of 13)]
[im 1/20]
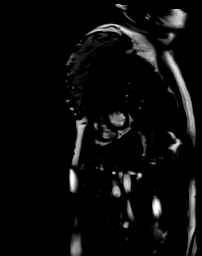

[Series 53: cine_trufi_short axis_cs_2_shot · oblique · 8.0mm · 1.48mm/px · 1 of 20 slices shown (9 of 13)]
[im 1/20]
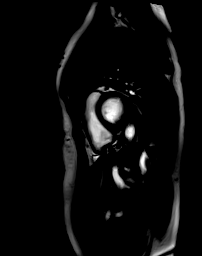

[Series 53: cine_trufi_short axis_cs_2_shot · oblique · 8.0mm · 1.48mm/px · 1 of 20 slices shown (10 of 13)]
[im 1/20]
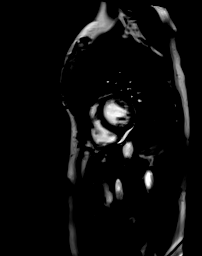

[Series 53: cine_trufi_short axis_cs_2_shot · oblique · 8.0mm · 1.48mm/px · 1 of 20 slices shown (11 of 13)]
[im 1/20]
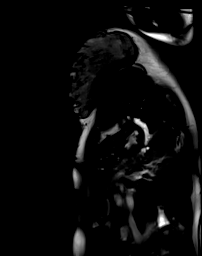

[Series 53: cine_trufi_short axis_cs_2_shot · oblique · 8.0mm · 1.48mm/px · 1 of 20 slices shown (12 of 13)]
[im 1/20]
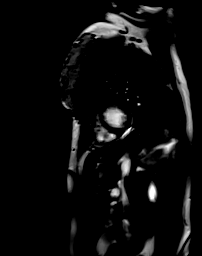

[Series 53: cine_trufi_short axis_cs_2_shot · oblique · 8.0mm · 1.48mm/px · 1 of 20 slices shown (13 of 13)]
[im 1/20]
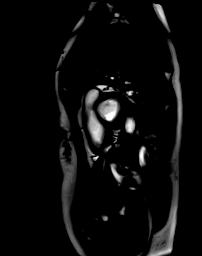

[45 of 48 positions shown; findings below may reference images not displayed]

This examination is tailored for evaluation cardiac anatomy and
function and provides very limited assessment of noncardiac
structures, which are accordingly not evaluated during
interpretation. If there is clinical concern for extracardiac
pathology, further evaluation with CT imaging should be considered.
FINDINGS: LEFT VENTRICLE:

Normal left ventricular chamber size.

Normal left ventricular wall thickness.

Normal left ventricular systolic function.

LVEF = 62%

There are no regional wall motion abnormalities.

No myocardial edema, T2 = 49 ms

Normal first pass perfusion.

There is no post contrast delayed myocardial enhancement.

Normal T1 myocardial nulling kinetics suggest against a diagnosis of
cardiac amyloidosis.

ECV = 27%

RIGHT VENTRICLE:

Normal right ventricular chamber size.

Normal right ventricular wall thickness.

Normal right ventricular systolic function.

RVEF = 55%

There are no regional wall motion abnormalities.

No post contrast delayed myocardial enhancement.

ATRIA:

Normal left atrial size.

Normal right atrial size.

VALVES:

No significant valvular abnormalities.

PERICARDIUM:

Normal pericardium.  No pericardial effusion.

OTHER: No significant extracardiac findings.

MEASUREMENTS:
Left ventricle:

LV Female

LV EF: 62% (normal 56-78%)

Absolute volumes:

LV EDV: 117mL (Normal 52-141 mL)

LV ESV: 45mL (Normal 13-51 mL)

LV SV: 73mL (Normal 33-97 mL)

CO: 4.0L/min (Normal 2.7-6.0 L/min)

Indexed volumes:

LV EDV: 77mL/sq-m (Normal 41-81 mL/sq-m)

LV ESV: 29mL/sq-m (Normal 12-21 mL/sq-m)

LV SV: 48mL/sq-m (Normal 26-56 mL/sq-m)

CI: 2.63L/min/sq-m (Normal 1.8-3.8 L/min/sq-m)

Right ventricle:

RV female

RV EF: 53% (normal 47-80%)

Absolute volumes:

RV EDV: 137 mL (Normal 58-154 mL)

RV ESV: 64 mL (Normal 12-68 mL)

RV SV: 73 mL (Normal 35-98 mL)

CO: 4.0 L/min (Normal 2.7-6 L/min)

Indexed volumes:

RV EDV: 90 ML/sq-m (Normal 48-87 mL/sq-m)

RV ESV: 42 mL/sq-m (Normal 11-28 mL/sq-m)

RV SV: 47 mL/sq-m (Normal 27-57 mL/sq-m)

CI: 2.62 L/min/sq-m (Normal 1.8-3.8 L/min/sq-m)
IMPRESSION: 1. Normal biventricular chamber size and function. LVEF 62%, RVEF
53%.

2. No definite delayed myocardial enhancement. No scar, fibrosis,
infarct, inflammation or infiltrative process seen.

## 2023-05-28 ENCOUNTER — Ambulatory Visit: Payer: Managed Care, Other (non HMO) | Admitting: Dermatology

## 2023-07-18 LAB — COMPREHENSIVE METABOLIC PANEL
Albumin: 4.6 (ref 3.5–5.0)
Calcium: 9.7 (ref 8.7–10.7)
eGFR: 110

## 2023-07-18 LAB — HEPATITIS B SURFACE ANTIGEN: Hepatitis B Surface Ag: POSITIVE

## 2023-07-18 LAB — BASIC METABOLIC PANEL
BUN: 12 (ref 4–21)
CO2: 31 — AB (ref 13–22)
Chloride: 104 (ref 99–108)
Creatinine: 0.7 (ref 0.5–1.1)
Glucose: 100
Potassium: 4.5 meq/L (ref 3.5–5.1)
Sodium: 139 (ref 137–147)

## 2023-07-18 LAB — CBC AND DIFFERENTIAL
HCT: 42 (ref 36–46)
Hemoglobin: 14 (ref 12.0–16.0)
Platelets: 278 10*3/uL (ref 150–400)
WBC: 7.8

## 2023-07-18 LAB — HEPATIC FUNCTION PANEL
ALT: 18 U/L (ref 7–35)
AST: 31 (ref 13–35)
Alkaline Phosphatase: 49 (ref 25–125)
Bilirubin, Total: 0.6

## 2023-07-18 LAB — CBC: RBC: 4.86 (ref 3.87–5.11)

## 2023-07-18 LAB — POCT INR: INR: 1 (ref 0.80–1.20)

## 2023-07-18 LAB — PROTIME-INR: Protime: 10.9 (ref 10.0–13.8)

## 2023-07-23 ENCOUNTER — Encounter: Payer: Self-pay | Admitting: Physician Assistant

## 2023-08-21 ENCOUNTER — Other Ambulatory Visit: Payer: Self-pay | Admitting: Physician Assistant

## 2023-08-21 DIAGNOSIS — Z1231 Encounter for screening mammogram for malignant neoplasm of breast: Secondary | ICD-10-CM

## 2023-08-27 ENCOUNTER — Other Ambulatory Visit: Payer: Self-pay | Admitting: Gastroenterology

## 2023-08-27 DIAGNOSIS — B181 Chronic viral hepatitis B without delta-agent: Secondary | ICD-10-CM

## 2023-08-30 ENCOUNTER — Ambulatory Visit
Admission: RE | Admit: 2023-08-30 | Discharge: 2023-08-30 | Disposition: A | Discharge status: Home / Self Care | Payer: Managed Care, Other (non HMO) | Source: Ambulatory Visit | Attending: Gastroenterology | Admitting: Gastroenterology

## 2023-08-30 DIAGNOSIS — B181 Chronic viral hepatitis B without delta-agent: Secondary | ICD-10-CM

## 2023-09-19 ENCOUNTER — Ambulatory Visit
Admission: RE | Admit: 2023-09-19 | Discharge: 2023-09-19 | Disposition: A | Discharge status: Home / Self Care | Payer: Managed Care, Other (non HMO) | Source: Ambulatory Visit | Attending: Physician Assistant

## 2023-09-19 DIAGNOSIS — Z1231 Encounter for screening mammogram for malignant neoplasm of breast: Secondary | ICD-10-CM

## 2024-03-10 ENCOUNTER — Encounter: Payer: Self-pay | Admitting: Physician Assistant

## 2024-03-10 MED ORDER — COVID-19 MRNA VACCINE (PFIZER) 30 MCG/0.3ML IM SUSP: 0.3000 mL | Freq: Once | INTRAMUSCULAR | 0 refills | Status: AC

## 2024-09-19 ENCOUNTER — Encounter

## 2024-09-19 DIAGNOSIS — Z1231 Encounter for screening mammogram for malignant neoplasm of breast: Secondary | ICD-10-CM
